# Patient Record
Sex: Male | Born: 1990 | Race: White | Hispanic: No | Marital: Single | Smoking: Never smoker
Health system: Southern US, Community
[De-identification: ages and names within clinical notes are randomized; demographics above are authoritative.]

## PROBLEM LIST (undated history)

## (undated) DIAGNOSIS — K219 Gastro-esophageal reflux disease without esophagitis: Secondary | ICD-10-CM

## (undated) DIAGNOSIS — J8281 Chronic eosinophilic pneumonia: Secondary | ICD-10-CM

## (undated) DIAGNOSIS — F410 Panic disorder [episodic paroxysmal anxiety] without agoraphobia: Secondary | ICD-10-CM

## (undated) DIAGNOSIS — F419 Anxiety disorder, unspecified: Secondary | ICD-10-CM

## (undated) DIAGNOSIS — G7281 Critical illness myopathy: Secondary | ICD-10-CM

## (undated) DIAGNOSIS — J984 Other disorders of lung: Secondary | ICD-10-CM

## (undated) DIAGNOSIS — J82 Pulmonary eosinophilia, not elsewhere classified: Secondary | ICD-10-CM

## (undated) HISTORY — DX: Critical illness myopathy: G72.81

## (undated) HISTORY — DX: Anxiety disorder, unspecified: F41.9

## (undated) HISTORY — DX: Gastro-esophageal reflux disease without esophagitis: K21.9

## (undated) HISTORY — DX: Chronic eosinophilic pneumonia: J82.81

## (undated) HISTORY — DX: Pulmonary eosinophilia, not elsewhere classified: J82

## (undated) HISTORY — DX: Panic disorder (episodic paroxysmal anxiety): F41.0

## (undated) HISTORY — DX: Other disorders of lung: J98.4

## (undated) HISTORY — PX: LUNG BIOPSY: SHX232

---
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
## 2011-02-06 LAB — CBC
HCT: 30.6 — ABNORMAL LOW
HCT: 30.8 — ABNORMAL LOW
HCT: 35.8
HCT: 41.6
Hemoglobin: 10.8 — ABNORMAL LOW
Hemoglobin: 12.4
Hemoglobin: 14.8 — ABNORMAL HIGH
MCHC: 34.6 — ABNORMAL HIGH
MCHC: 34.7 — ABNORMAL HIGH
MCHC: 34.9 — ABNORMAL HIGH
MCV: 87
MCV: 87.1
MCV: 87.2
Platelets: 383
RBC: 3.52 — ABNORMAL LOW
RBC: 3.54 — ABNORMAL LOW
RBC: 4.11
RBC: 4.87
RDW: 12.4
WBC: 5.6
WBC: 6.5
WBC: 8
WBC: 6.7

## 2011-02-06 LAB — POCT I-STAT 7, (LYTES, BLD GAS, ICA,H+H)
Acid-Base Excess: 1
Acid-Base Excess: 2
Acid-Base Excess: 3 — ABNORMAL HIGH
Acid-Base Excess: 3 — ABNORMAL HIGH
Acid-Base Excess: 7 — ABNORMAL HIGH
Bicarbonate: 24.9 — ABNORMAL HIGH
Bicarbonate: 25.9 — ABNORMAL HIGH
Bicarbonate: 26 — ABNORMAL HIGH
Bicarbonate: 26.5 — ABNORMAL HIGH
Bicarbonate: 26.6 — ABNORMAL HIGH
Bicarbonate: 27.5 — ABNORMAL HIGH
Bicarbonate: 28 — ABNORMAL HIGH
Bicarbonate: 29.7 — ABNORMAL HIGH
Bicarbonate: 31.9 — ABNORMAL HIGH
Calcium, Ion: 1.15
Calcium, Ion: 1.19
Calcium, Ion: 1.21
Calcium, Ion: 1.21
Calcium, Ion: 1.24
Calcium, Ion: 1.24
HCT: 28 — ABNORMAL LOW
HCT: 31 — ABNORMAL LOW
HCT: 32 — ABNORMAL LOW
HCT: 33
HCT: 33
Hemoglobin: 10.5 — ABNORMAL LOW
Hemoglobin: 10.9 — ABNORMAL LOW
Hemoglobin: 11.2
Hemoglobin: 11.2
Hemoglobin: 11.9
Hemoglobin: 11.9
Hemoglobin: 9.5 — ABNORMAL LOW
O2 Saturation: 90
O2 Saturation: 90
O2 Saturation: 93
O2 Saturation: 93
O2 Saturation: 95
O2 Saturation: 95
O2 Saturation: 96
O2 Saturation: 97
O2 Saturation: 99
Operator id: 190281
Operator id: 23604
Operator id: 236041
Operator id: 257081
Operator id: 270211
Operator id: 270221
Operator id: 277341
Patient temperature: 36.4
Patient temperature: 36.7
Patient temperature: 37.1
Patient temperature: 37.3
Patient temperature: 38
Patient temperature: 39.2
Patient temperature: 97.8
Patient temperature: 97.8
Patient temperature: 97.9
Potassium: 3.1 — ABNORMAL LOW
Potassium: 3.4 — ABNORMAL LOW
Potassium: 3.6
Potassium: 3.7
Potassium: 3.9
Potassium: 3.9
Potassium: 4.1
Sodium: 134 — ABNORMAL LOW
Sodium: 135
Sodium: 135
Sodium: 136
Sodium: 138
TCO2: 26
TCO2: 27
TCO2: 27
TCO2: 28
TCO2: 28
TCO2: 31
TCO2: 33
pCO2 arterial: 41.4
pCO2 arterial: 42.8
pCO2 arterial: 43.6
pCO2 arterial: 43.7
pCO2 arterial: 43.8
pCO2 arterial: 44.2
pCO2 arterial: 45.7 — ABNORMAL HIGH
pCO2 arterial: 47 — ABNORMAL HIGH
pCO2 arterial: 49.1 — ABNORMAL HIGH
pH, Arterial: 7.336 — ABNORMAL LOW
pH, Arterial: 7.389
pH, Arterial: 7.41
pH, Arterial: 7.417
pH, Arterial: 7.417
pH, Arterial: 7.421
pO2, Arterial: 57 — ABNORMAL LOW
pO2, Arterial: 57 — ABNORMAL LOW
pO2, Arterial: 60 — ABNORMAL LOW
pO2, Arterial: 62 — ABNORMAL LOW
pO2, Arterial: 68 — ABNORMAL LOW
pO2, Arterial: 70 — ABNORMAL LOW
pO2, Arterial: 72 — ABNORMAL LOW
pO2, Arterial: 74 — ABNORMAL LOW
pO2, Arterial: 95

## 2011-02-06 LAB — POCT I-STAT 3, ART BLOOD GAS (G3+)
Acid-Base Excess: 4 — ABNORMAL HIGH
Acid-Base Excess: 5 — ABNORMAL HIGH
Acid-Base Excess: 6 — ABNORMAL HIGH
Acid-Base Excess: 7 — ABNORMAL HIGH
Acid-Base Excess: 7 — ABNORMAL HIGH
Acid-base deficit: 2
Bicarbonate: 24.4 — ABNORMAL HIGH
Bicarbonate: 29.2 — ABNORMAL HIGH
Bicarbonate: 29.5 — ABNORMAL HIGH
Bicarbonate: 31.6 — ABNORMAL HIGH
Bicarbonate: 33.3 — ABNORMAL HIGH
O2 Saturation: 89
O2 Saturation: 91
O2 Saturation: 92
O2 Saturation: 96
O2 Saturation: 97
Operator id: 234041
Operator id: 245011
Operator id: 245011
Operator id: 297571
Operator id: 297571
Operator id: 297571
Patient temperature: 36.3
Patient temperature: 36.8
Patient temperature: 37
Patient temperature: 37
Patient temperature: 37
TCO2: 23
TCO2: 30
TCO2: 31
TCO2: 33
TCO2: 35
pCO2 arterial: 39.3
pCO2 arterial: 47.2 — ABNORMAL HIGH
pH, Arterial: 7.333 — ABNORMAL LOW
pH, Arterial: 7.376
pH, Arterial: 7.414
pH, Arterial: 7.434
pH, Arterial: 7.477 — ABNORMAL HIGH
pH, Arterial: 7.51 — ABNORMAL HIGH
pO2, Arterial: 113 — ABNORMAL HIGH
pO2, Arterial: 52 — ABNORMAL LOW
pO2, Arterial: 63 — ABNORMAL LOW
pO2, Arterial: 82
pO2, Arterial: 88

## 2011-02-06 LAB — LEGIONELLA PROFILE(CULTURE+DFA/SMEAR): Legionella Antigen (DFA): NEGATIVE

## 2011-02-06 LAB — AFB CULTURE WITH SMEAR (NOT AT ARMC)

## 2011-02-06 LAB — DIFFERENTIAL
Basophils Absolute: 0
Basophils Absolute: 0
Basophils Relative: 0
Basophils Relative: 0
Basophils Relative: 0
Eosinophils Absolute: 0.6
Eosinophils Relative: 0
Eosinophils Relative: 8 — ABNORMAL HIGH
Lymphocytes Relative: 10 — ABNORMAL LOW
Lymphocytes Relative: 7 — ABNORMAL LOW
Lymphs Abs: 0.4 — ABNORMAL LOW
Lymphs Abs: 0.7 — ABNORMAL LOW
Monocytes Absolute: 0.3
Monocytes Absolute: 0.7
Monocytes Relative: 8
Neutro Abs: 10.4 — ABNORMAL HIGH
Neutro Abs: 4.6
Neutrophils Relative %: 75 — ABNORMAL HIGH
Neutrophils Relative %: 87 — ABNORMAL HIGH
Neutrophils Relative %: 69 — ABNORMAL HIGH

## 2011-02-06 LAB — TRIGLYCERIDES: Triglycerides: 77

## 2011-02-06 LAB — VIRUS CULTURE
Preliminary Culture: NEGATIVE
Preliminary Culture: NEGATIVE

## 2011-02-06 LAB — ANA: Anti Nuclear Antibody(ANA): NEGATIVE

## 2011-02-06 LAB — COMPREHENSIVE METABOLIC PANEL
ALT: 113 — ABNORMAL HIGH
AST: 23
AST: 25
AST: 28
AST: 46 — ABNORMAL HIGH
Albumin: 2.7 — ABNORMAL LOW
Albumin: 4.2
Alkaline Phosphatase: 140
Alkaline Phosphatase: 95
BUN: 10
BUN: 12
CO2: 28
CO2: 28
CO2: 30
CO2: 35 — ABNORMAL HIGH
Calcium: 8.6
Calcium: 8.7
Calcium: 8.8
Calcium: 8.8
Chloride: 101
Chloride: 103
Creatinine, Ser: 0.55
Creatinine, Ser: 0.55
Creatinine, Ser: 0.62
Glucose, Bld: 128 — ABNORMAL HIGH
Glucose, Bld: 200 — ABNORMAL HIGH
Glucose, Bld: 95
Potassium: 4.3
Potassium: 4.3
Sodium: 134 — ABNORMAL LOW
Sodium: 140
Total Bilirubin: 0.6
Total Bilirubin: 1.2
Total Protein: 5.4 — ABNORMAL LOW
Total Protein: 6.1
Total Protein: 6.3

## 2011-02-06 LAB — SEDIMENTATION RATE: Sed Rate: 52 — ABNORMAL HIGH

## 2011-02-06 LAB — TISSUE CULTURE

## 2011-02-06 LAB — MISCELLANEOUS TEST
Miscellaneous Test Results: <1000
Miscellaneous Test Results: NOT DETECTED

## 2011-02-06 LAB — BASIC METABOLIC PANEL
Calcium: 9.4
Glucose, Bld: 159 — ABNORMAL HIGH
Sodium: 136

## 2011-02-06 LAB — FUNGUS CULTURE W SMEAR
Fungal Smear: NONE SEEN
Fungal Smear: NONE SEEN
Fungal Smear: NONE SEEN
Fungal Smear: NONE SEEN

## 2011-02-06 LAB — I-STAT EC8
Acid-Base Excess: 1
Bicarbonate: 25.3 — ABNORMAL HIGH
Chloride: 101
HCT: 37
Operator id: 257081
pCO2 arterial: 39.5
pH, Arterial: 7.414

## 2011-02-06 LAB — URINALYSIS, ROUTINE W REFLEX MICROSCOPIC
Bilirubin Urine: NEGATIVE
Glucose, UA: NEGATIVE
Hgb urine dipstick: NEGATIVE
Hgb urine dipstick: NEGATIVE
Ketones, ur: 15 — AB
Protein, ur: 100 — AB
Specific Gravity, Urine: 1.024
pH: 5.5
pH: 5.5

## 2011-02-06 LAB — HISTOPLASMA ANTIGEN, URINE
Histoplasma Antigen Urine: NEGATIVE
Histoplasma Antigen, urine: <2 {EIA'U}

## 2011-02-06 LAB — URINE MICROSCOPIC-ADD ON

## 2011-02-06 LAB — CARBOXYHEMOGLOBIN
Carboxyhemoglobin: 1.3
Carboxyhemoglobin: 1.3
O2 Saturation: 91.1
O2 Saturation: 92.9
Total hemoglobin: 11.3 — ABNORMAL LOW

## 2011-02-06 LAB — BODY FLUID CULTURE
Culture: NO GROWTH
Culture: NO GROWTH
Gram Stain: NONE SEEN

## 2011-02-06 LAB — C4 COMPLEMENT: Complement C4, Body Fluid: 16

## 2011-02-06 LAB — APTT: aPTT: 22 — ABNORMAL LOW

## 2011-02-06 LAB — CULTURE, BAL-QUANTITATIVE W GRAM STAIN
Colony Count: NO GROWTH
Culture: NO GROWTH
Gram Stain: NONE SEEN

## 2011-02-06 LAB — PROTIME-INR: INR: 1.2

## 2011-02-06 LAB — IGE: IgE (Immunoglobulin E), Serum: 426.1 — ABNORMAL HIGH

## 2011-02-06 LAB — C-REACTIVE PROTEIN
CRP: 1.5 — ABNORMAL HIGH (ref <0.6)
CRP: 14.4 — ABNORMAL HIGH (ref <0.6)

## 2011-02-06 LAB — P CARINII SMEAR DFA: Pneumocystis carinii DFA: NEGATIVE

## 2011-02-06 LAB — RHEUMATOID FACTOR: Rhuematoid fact SerPl-aCnc: <20

## 2011-02-06 LAB — EXPECTORATED SPUTUM ASSESSMENT W GRAM STAIN, RFLX TO RESP C

## 2011-02-06 LAB — CULTURE, RESPIRATORY W GRAM STAIN

## 2011-02-06 LAB — HIV ANTIBODY (ROUTINE TESTING W REFLEX): HIV: NONREACTIVE

## 2011-02-06 LAB — LEGIONELLA ANTIGEN, URINE: Legionella Antigen, Urine: NEGATIVE

## 2011-02-06 LAB — CULTURE, BLOOD (ROUTINE X 2): Culture: NO GROWTH

## 2011-12-20 ENCOUNTER — Encounter: Payer: Self-pay | Admitting: Critical Care Medicine

## 2011-12-21 ENCOUNTER — Ambulatory Visit: Payer: Self-pay | Admitting: Critical Care Medicine

## 2012-01-11 ENCOUNTER — Ambulatory Visit: Payer: Self-pay | Admitting: Critical Care Medicine

## 2012-02-05 ENCOUNTER — Encounter: Payer: Self-pay | Admitting: Critical Care Medicine

## 2012-02-05 ENCOUNTER — Ambulatory Visit (INDEPENDENT_AMBULATORY_CARE_PROVIDER_SITE_OTHER): Payer: BC Managed Care – PPO | Admitting: Critical Care Medicine

## 2012-02-05 VITALS — BP 140/98 | HR 110 | Temp 98.3°F | Ht 72.0 in | Wt 186.0 lb

## 2012-02-05 DIAGNOSIS — J984 Other disorders of lung: Secondary | ICD-10-CM

## 2012-02-05 DIAGNOSIS — J8289 Other pulmonary eosinophilia, not elsewhere classified: Secondary | ICD-10-CM

## 2012-02-05 MED ORDER — MOMETASONE FUROATE 50 MCG/ACT NA SUSP: 2.0000 | Freq: Every day | NASAL | Status: DC

## 2012-02-05 MED ORDER — BUDESONIDE 180 MCG/ACT IN AEPB: INHALATION_SPRAY | RESPIRATORY_TRACT | Status: DC

## 2012-02-05 NOTE — Patient Instructions (Signed)
Start Pulmicort two puff twice daily for two weeks then once daily Start Nasonex two puff daily each nostril No other medication changes Return 6 months

## 2012-02-05 NOTE — Progress Notes (Signed)
Subjective:    Patient ID: Matthew Valdez, male    DOB: October 02, 1990, 21 y.o.   MRN: [id number]  HPI 21 y.o. 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 Also hx of farming.  Pt in hosp for 6weeks . Pt @ [facility] one week, on vent, in icu. needed oscillator. Pt then tfr to [facility] through October. Then rehab two weeks.  Out pat rehab three weeks Until 12/08  Pt followed by [facility] peds pulmonary and now tfr to this office for ongoing care.  Pt is slowly better. Played high school soccer and baseball. Pt denies any cough. Notes chest pain with strenous activity. No real sinus pressure. No real wheeze. ? if proair helps prior to exercise  PFTs show restriction only.  Pt on on celexa. Pt has friends notice mood changes. . Pt gets more anxious and mood changes  Pt gets angry at times  No real flashbacks with illness   02/05/2012 Pt still at school at [employer].  Off celexa 7months. Pt with anxiety issues.  Pt stays nervous a lot. Pt notes more cough past two weeks.  More allergy issues.  No real chest pain, just more cough. More brown mucus. occ clear.  No real chest pain. Able to run a mile three times per week.  Some chest pain on right side.  Notes some pndrip.   Past Medical History  Diagnosis Date  . Panic disorder   . Anxiety disorder   . Critical illness myopathy   . GERD (gastroesophageal reflux disease)   . Eosinophilic pneumonia   . Restrictive lung disease      Family History  Problem Relation Age of Onset  . Pancreatic cancer Paternal Grandfather      History   Social History  . Marital Status: Single    Spouse Name: N/A    Number of Children: N/A  . Years of Education: N/A   Occupational History  . Not on file.   Social History Main Topics  . Smoking status: Never Smoker   . Smokeless tobacco: Current User  . Alcohol Use: Yes     socially  . Drug Use: Not on file  .  Sexually Active: Not on file   Other Topics Concern  . Not on file   Social History Narrative  . No narrative on file     Allergies  Allergen Reactions  . Sulfonamide Derivatives      Outpatient Prescriptions Prior to Visit  Medication Sig Dispense Refill  . citalopram (CELEXA) 10 MG tablet Take 10 mg by mouth daily.      Marland Kitchen albuterol (PROVENTIL HFA;VENTOLIN HFA) 108 (90 BASE) MCG/ACT inhaler Inhale 1-2 puffs into the lungs every 4 (four) hours as needed.          Review of Systems Constitutional:   No  weight loss, night sweats,  Fevers, chills, fatigue, lassitude. HEENT:   No headaches,  Difficulty swallowing,  Tooth/dental problems,  Sore throat,                No sneezing, itching, ear ache, nasal congestion, ++++post nasal drip,   CV:  No chest pain,  Orthopnea, PND, swelling in lower extremities, anasarca, dizziness, palpitations  GI  No heartburn, indigestion, abdominal pain, nausea, vomiting, diarrhea, change in bowel habits, loss of appetite  Resp: Notes shortness of breath with exertion not at rest.  No  excess mucus, notes  productive cough,  No non-productive cough,  No coughing up of blood.  Notes  change in color of mucus.  No wheezing.  No chest wall deformity  Skin: no rash or lesions.  GU: no dysuria, change in color of urine, no urgency or frequency.  No flank pain.  MS:  No joint pain or swelling.  No decreased range of motion.  No back pain.  Psych:  No change in mood or affect. No depression or anxiety.  No memory loss.     Objective:   Physical Exam Filed Vitals:   02/05/12 1110  BP: 140/98  Pulse: 110  Temp: 98.3 F (36.8 C)  TempSrc: Oral  Height: 6' (1.829 m)  Weight: 186 lb (84.369 kg)  SpO2: 94%    Gen: Pleasant, well-nourished, in no distress,  normal affect  ENT: No lesions,  mouth clear,  oropharynx clear, no postnasal drip  Neck: No JVD, no TMG, no carotid bruits  Lungs: No use of accessory muscles, no dullness to percussion,  clear without rales or rhonchi  Cardiovascular: RRR, heart sounds normal, no murmur or gallops, no peripheral edema  Abdomen: soft and NT, no HSM,  BS normal  Musculoskeletal: No deformities, no cyanosis or clubbing  Neuro: alert, non focal  Skin: Warm, no lesions or rashes  Cleda Daub 02/05/2012  Mild restriction and obstruction        Assessment & Plan:   Moderate persistent asthma History of eosinophilic pneumonia now with no evidence of recurrence but ongoing moderate persistent asthma with external atopic features Plan Begin Pulmicort 2 puff twice daily for 2 weeks then reduce to 2 puffs daily thereafter Begin Nasonex 2 puffs each nostril daily Patient was instructed as to proper use of the inhalers Return 6 months   Updated Medication List Outpatient Encounter Prescriptions as of 02/05/2012  Medication Sig Dispense Refill  . citalopram (CELEXA) 10 MG tablet Take 10 mg by mouth daily.      Marland Kitchen albuterol (PROVENTIL HFA;VENTOLIN HFA) 108 (90 BASE) MCG/ACT inhaler Inhale 1-2 puffs into the lungs every 4 (four) hours as needed.      . budesonide (PULMICORT FLEXHALER) 180 MCG/ACT inhaler Two puff twice daily for 2 weeks then daily  1 each  5  . mometasone (NASONEX) 50 MCG/ACT nasal spray Place 2 sprays into the nose daily.  17 g  1

## 2012-02-05 NOTE — Assessment & Plan Note (Signed)
History of eosinophilic pneumonia now with no evidence of recurrence but ongoing moderate persistent asthma with external atopic features Plan Begin Pulmicort 2 puff twice daily for 2 weeks then reduce to 2 puffs daily thereafter Begin Nasonex 2 puffs each nostril daily Patient was instructed as to proper use of the inhalers Return 6 months

## 2012-07-31 ENCOUNTER — Other Ambulatory Visit: Payer: Self-pay | Admitting: Family Medicine

## 2012-08-22 ENCOUNTER — Encounter: Payer: Self-pay | Admitting: Critical Care Medicine

## 2012-08-22 ENCOUNTER — Ambulatory Visit (INDEPENDENT_AMBULATORY_CARE_PROVIDER_SITE_OTHER): Payer: BC Managed Care – PPO | Admitting: Critical Care Medicine

## 2012-08-22 VITALS — BP 120/78 | HR 74 | Temp 97.8°F | Ht 72.0 in | Wt 192.0 lb

## 2012-08-22 DIAGNOSIS — J45909 Unspecified asthma, uncomplicated: Secondary | ICD-10-CM

## 2012-08-22 DIAGNOSIS — J984 Other disorders of lung: Secondary | ICD-10-CM

## 2012-08-22 DIAGNOSIS — Z8709 Personal history of other diseases of the respiratory system: Secondary | ICD-10-CM | POA: Insufficient documentation

## 2012-08-22 NOTE — Progress Notes (Signed)
Subjective:    Patient ID: Matthew Valdez, male    DOB: [birth year], 22 y.o.   MRN: [id number]  HPI  22 y.o. [facility]   08/22/2012 No real wheeze.  Notes some cough.  Notes some nasal drainage.  No real dyspnea. Anxiety is better.  4 more classes. PT assistant program.   Past Medical History  Diagnosis Date  . Panic disorder   . Anxiety disorder   . Critical illness myopathy   . GERD (gastroesophageal reflux disease)   . Eosinophilic pneumonia   . Restrictive lung disease      Family History  Problem Relation Age of Onset  . Pancreatic cancer Paternal Grandfather      History   Social History  . Marital Status: Single    Spouse Name: N/A    Number of Children: N/A  . Years of Education: N/A   Occupational History  . Not on file.   Social History Main Topics  . Smoking status: Never Smoker   . Smokeless tobacco: Former Neurosurgeon  . Alcohol Use: Yes     Comment: socially  . Drug Use: Not on file  . Sexually Active: Not on file   Other Topics Concern  . Not on file   Social History Narrative  . No narrative on file     Allergies  Allergen Reactions  . Sulfonamide Derivatives      Outpatient Prescriptions Prior to Visit  Medication Sig Dispense Refill  . albuterol (PROVENTIL HFA;VENTOLIN HFA) 108 (90 BASE) MCG/ACT inhaler Inhale 1-2 puffs into the lungs every 4 (four) hours as needed.      . budesonide (PULMICORT FLEXHALER) 180 MCG/ACT inhaler Two puff twice daily for 2 weeks then daily  1 each  5  . citalopram (CELEXA) 10 MG tablet Take 10 mg by mouth daily.      Marland Kitchen escitalopram (LEXAPRO) 20 MG tablet 1 po qd  30 tablet  0  . mometasone (NASONEX) 50 MCG/ACT nasal spray Place 2 sprays into the nose daily.  17 g  1   No facility-administered medications prior to visit.   Review of Systems  Constitutional:   No  weight loss, night sweats,  Fevers, chills, fatigue, lassitude. HEENT:   No headaches,  Difficulty swallowing,  Tooth/dental problems,  Sore throat,                 No sneezing, itching, ear ache, nasal congestion, ++post nasal drip,   CV:  No chest pain,  Orthopnea, PND, swelling in lower extremities, anasarca, dizziness, palpitations  GI  No heartburn, indigestion, abdominal pain, nausea, vomiting, diarrhea, change in bowel habits, loss of appetite  Resp: No shortness of breath with exertion not at rest.  No excess mucus, notes  productive cough,  No non-productive cough,  No coughing up of blood.  Notes  change in color of mucus.  No wheezing.  No chest wall deformity  Skin: no rash or lesions.  GU: no dysuria, change in color of urine, no urgency or frequency.  No flank pain.  MS:  No joint pain or swelling.  No decreased range of motion.  No back pain.  Psych:  No change in mood or affect. No depression or anxiety.  No memory loss.     Objective:   Physical Exam  Filed Vitals:   08/22/12 1043  BP: 120/78  Pulse: 74  Temp: 97.8 F (36.6 C)  TempSrc: Oral  Height: 6' (1.829 m)  Weight: 192 lb (87.091 kg)  SpO2: 96%    Gen: Pleasant, well-nourished, in no distress,  normal affect  ENT: No lesions,  mouth clear,  oropharynx clear, no postnasal drip  Neck: No JVD, no TMG, no carotid bruits  Lungs: No use of accessory muscles, no dullness to percussion, clear without rales or rhonchi  Cardiovascular: RRR, heart sounds normal, no murmur or gallops, no peripheral edema  Abdomen: soft and NT, no HSM,  BS normal  Musculoskeletal: No deformities, no cyanosis or clubbing  Neuro: alert, non focal  Skin: Warm, no lesions or rashes  Cleda Daub 08/22/2012  Mild restriction        Assessment & Plan:   Moderate persistent asthma Moderate persistent asthma stable at this time off inhaled medications Plan No additional inhaled steroids indicated  RESTRICTIVE LUNG DISEASE Restrictive lung disease on the basis of ARDS in 2008 now improved Note pulmonary functions stable compared to 2013 Plan No additional pulmonary function  testing needed Patient is to monitor weight gain and dietary habits Return as needed     Updated Medication List Outpatient Encounter Prescriptions as of 08/22/2012  Medication Sig Dispense Refill  . Vitamin D, Ergocalciferol, (DRISDOL) 50000 UNITS CAPS Take 50,000 Units by mouth every 7 (seven) days.      . [DISCONTINUED] Vitamin D, Ergocalciferol, (DRISDOL) 50000 UNITS CAPS Take 50,000 Units by mouth every 7 (seven) days.      . [DISCONTINUED] albuterol (PROVENTIL HFA;VENTOLIN HFA) 108 (90 BASE) MCG/ACT inhaler Inhale 1-2 puffs into the lungs every 4 (four) hours as needed.      . [DISCONTINUED] budesonide (PULMICORT FLEXHALER) 180 MCG/ACT inhaler Two puff twice daily for 2 weeks then daily  1 each  5  . [DISCONTINUED] citalopram (CELEXA) 10 MG tablet Take 10 mg by mouth daily.      . [DISCONTINUED] escitalopram (LEXAPRO) 20 MG tablet 1 po qd  30 tablet  0  . [DISCONTINUED] mometasone (NASONEX) 50 MCG/ACT nasal spray Place 2 sprays into the nose daily.  17 g  1   No facility-administered encounter medications on file as of 08/22/2012.

## 2012-08-22 NOTE — Assessment & Plan Note (Signed)
Moderate persistent asthma stable at this time off inhaled medications Plan No additional inhaled steroids indicated

## 2012-08-22 NOTE — Assessment & Plan Note (Signed)
Restrictive lung disease on the basis of ARDS in 2008 now improved Note pulmonary functions stable compared to 2013 Plan No additional pulmonary function testing needed Patient is to monitor weight gain and dietary habits Return as needed

## 2012-08-22 NOTE — Patient Instructions (Addendum)
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  [phone]    [phone]

## 2012-08-31 ENCOUNTER — Other Ambulatory Visit: Payer: Self-pay | Admitting: Family Medicine

## 2012-09-02 ENCOUNTER — Other Ambulatory Visit: Payer: Self-pay | Admitting: Family Medicine

## 2012-10-03 ENCOUNTER — Ambulatory Visit: Payer: Self-pay | Admitting: Family Medicine

## 2012-10-06 ENCOUNTER — Other Ambulatory Visit: Payer: Self-pay | Admitting: Family Medicine

## 2012-10-07 ENCOUNTER — Telehealth: Payer: Self-pay | Admitting: Family Medicine

## 2012-10-07 ENCOUNTER — Ambulatory Visit (INDEPENDENT_AMBULATORY_CARE_PROVIDER_SITE_OTHER): Payer: BC Managed Care – PPO | Admitting: General Practice

## 2012-10-07 ENCOUNTER — Encounter: Payer: Self-pay | Admitting: General Practice

## 2012-10-07 VITALS — BP 125/77 | HR 74 | Temp 97.2°F | Ht 72.0 in | Wt 186.0 lb

## 2012-10-07 DIAGNOSIS — J060 Acute laryngopharyngitis: Secondary | ICD-10-CM

## 2012-10-07 LAB — POCT RAPID STREP A (OFFICE): Rapid Strep A Screen: NEGATIVE

## 2012-10-07 MED ORDER — AMOXICILLIN 500 MG PO CAPS: 500.0000 mg | ORAL_CAPSULE | Freq: Two times a day (BID) | ORAL | Status: DC

## 2012-10-07 NOTE — Patient Instructions (Addendum)
Laryngitis At the top of your windpipe is your voice box. It is the source of your voice. Inside your voice box are 2 bands of muscles called vocal cords. When you breathe, your vocal cords are relaxed and open so that air can get into the lungs. When you decide to say something, these cords come together and vibrate. The sound from these vibrations goes into your throat and comes out through your mouth as sound. Laryngitis is an inflammation of the vocal cords that causes hoarseness, cough, loss of voice, sore throat, and dry throat. Laryngitis can be temporary (acute) or long-term (chronic). Most cases of acute laryngitis improve with time.Chronic laryngitis lasts for more than 3 weeks. CAUSES Laryngitis can often be related to excessive smoking, talking, or yelling, as well as inhalation of toxic fumes and allergies. Acute laryngitis is usually caused by a viral infection, vocal strain, measles or mumps, or bacterial infections. Chronic laryngitis is usually caused by vocal cord strain, vocal cord injury, postnasal drip, growths on the vocal cords, or acid reflux. SYMPTOMS   Cough.  Sore throat.  Dry throat. RISK FACTORS  Respiratory infections.  Exposure to irritating substances, such as cigarette smoke, excessive amounts of alcohol, stomach acids, and workplace chemicals.  Voice trauma, such as vocal cord injury from shouting or speaking too loud. DIAGNOSIS  Your cargiver will perform a physical exam. During the physical exam, your caregiver will examine your throat. The most common sign of laryngitis is hoarseness. Laryngoscopy may be necessary to confirm the diagnosis of this condition. This procedure allows your caregiver to look into the larynx. HOME CARE INSTRUCTIONS  Drink enough fluids to keep your urine clear or pale yellow.  Rest until you no longer have symptoms or as directed by your caregiver.  Breathe in moist air.  Take all medicine as directed by your  caregiver.  Do not smoke.  Talk as little as possible (this includes whispering).  Write on paper instead of talking until your voice is back to normal.  Follow up with your caregiver if your condition has not improved after 10 days. SEEK MEDICAL CARE IF:   You have trouble breathing.  You cough up blood.  You have persistent fever.  You have increasing pain.  You have difficulty swallowing. MAKE SURE YOU:  Understand these instructions.  Will watch your condition.  Will get help right away if you are not doing well or get worse. Document Released: 04/10/2005 Document Revised: 07/03/2011 Document Reviewed: 06/16/2010 Exit[employer] Patient Information 2014 [employer].  

## 2012-10-07 NOTE — Progress Notes (Signed)
  Subjective:    Patient ID: Matthew Valdez, male    DOB: 08-21-1990, 22 y.o.   MRN: [id number]  HPI Presents today with complaints of sore throat, change in voice (hoarse), and coughing. Reports onset was one day ago. Reports a productive cough, clear. Also drainage noted from left eye. Reports having seasonal allergies. Denies taking OTC medications.     Review of Systems  Constitutional: Negative for fever and chills.  HENT: Positive for congestion and sneezing. Negative for postnasal drip and sinus pressure.   Respiratory: Positive for cough. Negative for chest tightness, shortness of breath and wheezing.   Cardiovascular: Negative for chest pain and palpitations.  Gastrointestinal: Negative for abdominal pain and blood in stool.  Genitourinary: Negative for dysuria, flank pain and difficulty urinating.  Neurological: Positive for dizziness. Negative for headaches.       Objective:   Physical Exam  Constitutional: He is oriented to person, place, and time. He appears well-developed and well-nourished.  HENT:  Head: Normocephalic and atraumatic.  Right Ear: External ear normal.  Left Ear: External ear normal.  Mouth/Throat: Posterior oropharyngeal erythema present.  Cardiovascular: Normal rate, regular rhythm and normal heart sounds.   Pulmonary/Chest: Effort normal and breath sounds normal. No respiratory distress. He exhibits no tenderness.  Neurological: He is alert and oriented to person, place, and time.  Skin: Skin is warm and dry.  Psychiatric: He has a normal mood and affect.   Results for orders placed in visit on 10/07/12  POCT RAPID STREP A (OFFICE)      Result Value Range   Rapid Strep A Screen Negative  Negative          Assessment & Plan:  1. Sore throat and laryngitis - POCT rapid strep A - amoxicillin (AMOXIL) 500 MG capsule; Take 1 capsule (500 mg total) by mouth 2 (two) times daily.  Dispense: 20 capsule; Refill: 0 -increase fluid intake -Rest  body and voice -Refrain from talking loud, yelling, or straining voice -RTO in one week for follow up and sooner if possible -Patient verbalized understanding -Coralie Keens, FNP-C

## 2012-10-07 NOTE — Telephone Encounter (Signed)
appt made

## 2012-10-14 ENCOUNTER — Encounter: Payer: Self-pay | Admitting: General Practice

## 2012-10-14 ENCOUNTER — Ambulatory Visit (INDEPENDENT_AMBULATORY_CARE_PROVIDER_SITE_OTHER): Payer: BC Managed Care – PPO | Admitting: General Practice

## 2012-10-14 VITALS — BP 120/69 | HR 74 | Temp 97.1°F | Ht 72.0 in | Wt 192.0 lb

## 2012-10-14 DIAGNOSIS — J309 Allergic rhinitis, unspecified: Secondary | ICD-10-CM

## 2012-10-14 DIAGNOSIS — H0259 Other disorders affecting eyelid function: Secondary | ICD-10-CM

## 2012-10-14 DIAGNOSIS — J302 Other seasonal allergic rhinitis: Secondary | ICD-10-CM

## 2012-10-14 DIAGNOSIS — H0289 Other specified disorders of eyelid: Secondary | ICD-10-CM

## 2012-10-14 MED ORDER — METHYLPREDNISOLONE ACETATE 80 MG/ML IJ SUSP
80.0000 mg | Freq: Once | INTRAMUSCULAR | Status: AC
  Administered 2012-10-14: 80 mg via INTRAMUSCULAR

## 2012-10-14 NOTE — Progress Notes (Signed)
  Subjective:    Patient ID: Matthew Valdez, male    DOB: Jan 30, 1991, 22 y.o.   MRN: [id number]  HPI Presents today for follow up of sore throat and laryngitis. He reports those issues have resolved. He reports having dried secretions around eye in am for past two days. Also reports some puffiness to lower eye lids. Reports headache and dizziness during the day yesterday.     Review of Systems  Constitutional: Negative for fever and chills.  HENT: Negative for congestion, sore throat, rhinorrhea and sinus pressure.   Eyes: Positive for discharge and itching. Negative for photophobia, pain, redness and visual disturbance.       Exudate every morning  Respiratory: Negative for cough, chest tightness and shortness of breath.   Cardiovascular: Negative for chest pain and palpitations.  Genitourinary: Negative for dysuria and difficulty urinating.  Neurological: Positive for dizziness. Negative for weakness and headaches.       Objective:   Physical Exam  Constitutional: He is oriented to person, place, and time. He appears well-developed and well-nourished.  HENT:  Head: Normocephalic and atraumatic.  Right Ear: External ear normal.  Left Ear: External ear normal.  Eyes: EOM are normal. Right eye exhibits no discharge. Left eye exhibits no discharge. No scleral icterus.  Cardiovascular: Normal rate, regular rhythm and normal heart sounds.   Pulmonary/Chest: Effort normal and breath sounds normal. No respiratory distress. He exhibits no tenderness.  Neurological: He is alert and oriented to person, place, and time.  Skin: Skin is warm and dry.  Psychiatric: He has a normal mood and affect.          Assessment & Plan:  1. Superficial swelling of eyelid - methylPREDNISolone acetate (DEPO-MEDROL) injection 80 mg; Inject 1 mL (80 mg total) into the muscle once.  2. Seasonal allergies -avoid allergens -may take OTC allerga or claritin to help manage allergies -proper  handwashing -RTO if symptoms worsen or seek emergent medical attention -Patient verbalized understanding -Coralie Keens, FNP-C

## 2012-11-26 ENCOUNTER — Ambulatory Visit (INDEPENDENT_AMBULATORY_CARE_PROVIDER_SITE_OTHER): Payer: BC Managed Care – PPO | Admitting: Family Medicine

## 2012-11-26 ENCOUNTER — Encounter: Payer: Self-pay | Admitting: Family Medicine

## 2012-11-26 VITALS — BP 136/82 | HR 75 | Temp 98.1°F | Ht 72.0 in | Wt 186.0 lb

## 2012-11-26 DIAGNOSIS — Z8709 Personal history of other diseases of the respiratory system: Secondary | ICD-10-CM

## 2012-11-26 DIAGNOSIS — F411 Generalized anxiety disorder: Secondary | ICD-10-CM

## 2012-11-26 DIAGNOSIS — F410 Panic disorder [episodic paroxysmal anxiety] without agoraphobia: Secondary | ICD-10-CM

## 2012-11-26 NOTE — Progress Notes (Signed)
  Subjective:    Patient ID: Matthew Valdez, male    DOB: 05-01-1990, 22 y.o.   MRN: [id number]  HPI Patient comes in today for followup of anxiety posttraumatic stress disorder and a history of respiratory distress syndrome in his high school years. He is currently not taking any medication regularly. He mentions that he saw the pulmonologist in February.   Review of Systems  Constitutional: Positive for fatigue (improving). Negative for activity change and appetite change.  HENT: Negative.  Negative for ear pain, congestion, sore throat, postnasal drip and tinnitus.   Eyes: Negative.   Respiratory: Negative.  Negative for cough, shortness of breath and wheezing.   Cardiovascular: Negative for chest pain, palpitations and leg swelling.  Gastrointestinal: Negative for nausea, vomiting, abdominal pain, diarrhea and constipation.  Endocrine: Negative.   Musculoskeletal: Negative.  Negative for myalgias, back pain, joint swelling and arthralgias.  Allergic/Immunologic: Negative.   Neurological: Negative for dizziness, tremors, weakness and headaches.  Psychiatric/Behavioral: Negative.  Negative for sleep disturbance. The patient is not nervous/anxious.        Objective:   Physical Exam  Vitals reviewed. Constitutional: He is oriented to person, place, and time. He appears well-developed and well-nourished. No distress.  HENT:  Head: Normocephalic and atraumatic.  Right Ear: External ear normal.  Left Ear: External ear normal.  Nose: Nose normal.  Mouth/Throat: Oropharynx is clear and moist. No oropharyngeal exudate.  Eyes: Conjunctivae are normal. Pupils are equal, round, and reactive to light. Right eye exhibits no discharge. Left eye exhibits no discharge. No scleral icterus.  Neck: Normal range of motion. Neck supple. No thyromegaly present.  Cardiovascular: Normal rate, regular rhythm and normal heart sounds.  Exam reveals no gallop and no friction rub.   No murmur  heard. Pulmonary/Chest: Effort normal and breath sounds normal. No respiratory distress. He has no wheezes. He has no rales.  Abdominal: Soft. He exhibits no distension. There is no tenderness. There is no guarding.  Musculoskeletal: Normal range of motion. He exhibits no edema.  Lymphadenopathy:    He has no cervical adenopathy.  Neurological: He is alert and oriented to person, place, and time. He has normal reflexes.  Skin: Skin is warm. Rash noted. He is diaphoretic. No erythema.  Mild contact dermatitis on abdomen  Psychiatric: He has a normal mood and affect. His behavior is normal. Judgment and thought content normal.          Assessment & Plan:   PANIC DISORDER  ANXIETY DISORDER  ARDS survivor    Patient Instructions  Continue healthy lifestyle Always be safe as far as respiratory environments are concerned Always drink plenty of fluids and stay well hydrated   Be sure and get flu shot by the end of September or early October  Nyra Capes MD

## 2012-11-26 NOTE — Patient Instructions (Signed)
Continue healthy lifestyle Always be safe as far as respiratory environments are concerned Always drink plenty of fluids and stay well hydrated

## 2012-12-04 ENCOUNTER — Telehealth: Payer: Self-pay | Admitting: Family Medicine

## 2012-12-05 NOTE — Telephone Encounter (Signed)
Invalid number Attempted to call home and cell numbers [facility]

## 2012-12-16 NOTE — Telephone Encounter (Signed)
Attempted to call mother NA on cell LMOM at home

## 2013-01-20 ENCOUNTER — Ambulatory Visit: Payer: BC Managed Care – PPO | Admitting: Pulmonary Disease

## 2013-01-20 ENCOUNTER — Telehealth: Payer: Self-pay | Admitting: Critical Care Medicine

## 2013-01-20 NOTE — Telephone Encounter (Signed)
I spoke with pt mother. We scheduled her appt to come in and see RA in [place] tomorrow as pt has class and this works with his schedule

## 2013-01-21 ENCOUNTER — Ambulatory Visit: Payer: BC Managed Care – PPO | Admitting: Pulmonary Disease

## 2013-02-03 ENCOUNTER — Ambulatory Visit (INDEPENDENT_AMBULATORY_CARE_PROVIDER_SITE_OTHER): Payer: BC Managed Care – PPO | Admitting: Critical Care Medicine

## 2013-02-03 ENCOUNTER — Encounter: Payer: Self-pay | Admitting: Critical Care Medicine

## 2013-02-03 VITALS — BP 114/80 | HR 72 | Ht 72.0 in | Wt 185.0 lb

## 2013-02-03 DIAGNOSIS — Z23 Encounter for immunization: Secondary | ICD-10-CM

## 2013-02-03 DIAGNOSIS — J4551 Severe persistent asthma with (acute) exacerbation: Secondary | ICD-10-CM

## 2013-02-03 DIAGNOSIS — J45901 Unspecified asthma with (acute) exacerbation: Secondary | ICD-10-CM

## 2013-02-03 DIAGNOSIS — J45902 Unspecified asthma with status asthmaticus: Secondary | ICD-10-CM

## 2013-02-03 MED ORDER — AZITHROMYCIN 250 MG PO TABS: 250.0000 mg | ORAL_TABLET | Freq: Every day | ORAL | Status: DC

## 2013-02-03 MED ORDER — PREDNISONE 10 MG PO TABS: ORAL_TABLET | ORAL | Status: DC

## 2013-02-03 MED ORDER — BECLOMETHASONE DIPROPIONATE 40 MCG/ACT IN AERS: 2.0000 | INHALATION_SPRAY | Freq: Two times a day (BID) | RESPIRATORY_TRACT | Status: DC

## 2013-02-03 NOTE — Progress Notes (Signed)
Subjective:    Patient ID: Matthew Valdez, male    DOB: 04-May-1990, 22 y.o.   MRN: [id number]  HPI  22 y.o. [name]   08/22/2012 No real wheeze.  Notes some cough.  Notes some nasal drainage.  No real dyspnea. Anxiety is better.  4 more classes. PT assistant program.   02/03/2013 Chief Complaint  Patient presents with  . Follow-up    Breathing a little worse since last OV.  Has good days and bad days.  Has SOB mostly with exertion, chest tightness at times and prod cough with brown mucus.   Pt notes more tightness in chest .  Notes more mucus. Yellow to brown. Not real qhs dyspnea.  1 Pm.  (11-3pm) No real pndrip.  Notes some blowing of nose  Past Medical History  Diagnosis Date  . Panic disorder   . Anxiety disorder   . Critical illness myopathy   . GERD (gastroesophageal reflux disease)   . Eosinophilic pneumonia   . Restrictive lung disease      Family History  Problem Relation Age of Onset  . Pancreatic cancer Paternal Grandfather   . Hyperlipidemia Father      History   Social History  . Marital Status: Single    Spouse Name: N/A    Number of Children: N/A  . Years of Education: N/A   Occupational History  . Not on file.   Social History Main Topics  . Smoking status: Never Smoker   . Smokeless tobacco: Former Neurosurgeon  . Alcohol Use: Yes     Comment: socially  . Drug Use: Not on file  . Sexual Activity: Not on file   Other Topics Concern  . Not on file   Social History Narrative  . No narrative on file     Allergies  Allergen Reactions  . Sulfonamide Derivatives      Outpatient Prescriptions Prior to Visit  Medication Sig Dispense Refill  . Vitamin D, Ergocalciferol, (DRISDOL) 50000 UNITS CAPS TAKE ONE CAPSULE BY MOUTH WEEKLY  12 capsule  0   No facility-administered medications prior to visit.   Review of Systems  Constitutional:   No  weight loss, night sweats,  Fevers, chills, fatigue, lassitude. HEENT:   No headaches,  Difficulty  swallowing,  Tooth/dental problems,  Sore throat,                No sneezing, itching, ear ache, nasal congestion, ++post nasal drip,   CV:  No chest pain,  Orthopnea, PND, swelling in lower extremities, anasarca, dizziness, palpitations  GI  No heartburn, indigestion, abdominal pain, nausea, vomiting, diarrhea, change in bowel habits, loss of appetite  Resp: No shortness of breath with exertion not at rest.  No excess mucus, notes  productive cough,  No non-productive cough,  No coughing up of blood.  Notes  change in color of mucus.  No wheezing.  No chest wall deformity  Skin: no rash or lesions.  GU: no dysuria, change in color of urine, no urgency or frequency.  No flank pain.  MS:  No joint pain or swelling.  No decreased range of motion.  No back pain.  Psych:  No change in mood or affect. No depression or anxiety.  No memory loss.     Objective:   Physical Exam  Filed Vitals:   02/03/13 1427  BP: 114/80  Pulse: 72  Height: 6' (1.829 m)  Weight: 185 lb (83.915 kg)  SpO2: 98%    Gen:  Pleasant, well-nourished, in no distress,  normal affect  ENT: No lesions,  mouth clear,  oropharynx clear, no postnasal drip  Neck: No JVD, no TMG, no carotid bruits  Lungs: No use of accessory muscles, no dullness to percussion,expir wheezes, poor airflow   Cardiovascular: RRR, heart sounds normal, no murmur or gallops, no peripheral edema  Abdomen: soft and NT, no HSM,  BS normal  Musculoskeletal: No deformities, no cyanosis or clubbing  Neuro: alert, non focal  Skin: Warm, no lesions or rashes  Cleda Daub 02/04/2013  Mild restriction        Assessment & Plan:   Severe persistent asthma with exacerbation Severe persistent asthma with ongoing airway inflammation and not on controller therapy Note IgE >300 with multiple pos positive allergens Plan Pulse prednisone  Qvar 40 two puff bid Azithromycin x 5 days ??xolair??      Updated Medication List Outpatient  Encounter Prescriptions as of 02/03/2013  Medication Sig Dispense Refill  . cholecalciferol (VITAMIN D) 1000 UNITS tablet Take 1,000 Units by mouth daily.      Marland Kitchen azithromycin (ZITHROMAX) 250 MG tablet Take 1 tablet (250 mg total) by mouth daily. Take two once then one daily until gone  6 each  0  . beclomethasone (QVAR) 40 MCG/ACT inhaler Inhale 2 puffs into the lungs 2 (two) times daily.  1 Inhaler  12  . predniSONE (DELTASONE) 10 MG tablet Take 4 tablets daily for 5 days then stop  20 tablet  0  . [DISCONTINUED] Vitamin D, Ergocalciferol, (DRISDOL) 50000 UNITS CAPS TAKE ONE CAPSULE BY MOUTH WEEKLY  12 capsule  0   No facility-administered encounter medications on file as of 02/03/2013.

## 2013-02-03 NOTE — Patient Instructions (Signed)
Prednisone 10mg  Take 4 tablets daily for 5 days then stop Azithromycin 250mg  Take two once then one daily until gone Qvar two puff twice daily Labs today Return 2 months

## 2013-02-04 ENCOUNTER — Telehealth: Payer: Self-pay | Admitting: Critical Care Medicine

## 2013-02-04 LAB — ALLERGY FULL PROFILE
Allergen, D pternoyssinus,d7: <0.1 kU/L
Allergen,Goose feathers, e70: <0.1 kU/L
Alternaria Alternata: <0.1 kU/L
Aspergillus fumigatus, m3: <0.1 kU/L
Bahia Grass: 0.46 kU/L — ABNORMAL HIGH
Box Elder IgE: <0.1 kU/L
Cat Dander: 0.37 kU/L — ABNORMAL HIGH
Common Ragweed: 7.03 kU/L — ABNORMAL HIGH
Curvularia lunata: <0.1 kU/L
D. farinae: <0.1 kU/L
Dog Dander: 0.4 kU/L — ABNORMAL HIGH
G005 Rye, Perennial: 1.13 kU/L — ABNORMAL HIGH
Goldenrod: 0.27 kU/L — ABNORMAL HIGH
Helminthosporium halodes: <0.1 kU/L
House Dust Hollister: 0.1 kU/L — ABNORMAL HIGH
Lamb's Quarters: <0.1 kU/L
Plantain: 0.11 kU/L — ABNORMAL HIGH
Stemphylium Botryosum: <0.1 kU/L
Sycamore Tree: <0.1 kU/L

## 2013-02-04 NOTE — Telephone Encounter (Signed)
I will call pt re pos allergy testing results

## 2013-02-04 NOTE — Assessment & Plan Note (Addendum)
Severe persistent asthma with ongoing airway inflammation and not on controller therapy Note IgE >300 with multiple pos positive allergens Plan Pulse prednisone  Qvar 40 two puff bid Azithromycin x 5 days ??xolair??

## 2013-02-06 ENCOUNTER — Telehealth: Payer: Self-pay | Admitting: Critical Care Medicine

## 2013-02-06 NOTE — Telephone Encounter (Signed)
Pt returning dr wright's call can be reached at [phone].Matthew Valdez

## 2013-02-06 NOTE — Telephone Encounter (Signed)
I spoke with pt. I advised him Dr. Delford Field will return to the office tomorrow. He voiced his understanding. Will forward to Dr. Delford Field

## 2013-02-06 NOTE — Telephone Encounter (Signed)
Pt returned PW's call and can be reached @ [phone]. He stated he will be in class today and for [place] to call between 10:30-11:00 if possible. Leanora Ivanoff

## 2013-02-06 NOTE — Telephone Encounter (Signed)
This is a duplicate I will close and continue in previous phone note 02/04/13

## 2013-02-06 NOTE — Telephone Encounter (Signed)
Pt returned PW's call and can be reached @ [phone]. He stated he will be in class today and for us to call between 10:30-11:00 if possible. Emily E McAlister  

## 2013-04-02 ENCOUNTER — Encounter: Payer: Self-pay | Admitting: Internal Medicine

## 2013-04-02 ENCOUNTER — Ambulatory Visit (INDEPENDENT_AMBULATORY_CARE_PROVIDER_SITE_OTHER): Payer: BC Managed Care – PPO | Admitting: Internal Medicine

## 2013-04-02 ENCOUNTER — Ambulatory Visit (INDEPENDENT_AMBULATORY_CARE_PROVIDER_SITE_OTHER)
Admission: RE | Admit: 2013-04-02 | Discharge: 2013-04-02 | Disposition: A | Discharge status: Home / Self Care | Payer: BC Managed Care – PPO | Source: Ambulatory Visit | Attending: Internal Medicine | Admitting: Internal Medicine

## 2013-04-02 ENCOUNTER — Telehealth: Payer: Self-pay | Admitting: Critical Care Medicine

## 2013-04-02 VITALS — BP 114/78 | HR 86 | Temp 98.8°F | Ht 72.0 in | Wt 190.0 lb

## 2013-04-02 DIAGNOSIS — R0600 Dyspnea, unspecified: Secondary | ICD-10-CM

## 2013-04-02 DIAGNOSIS — R079 Chest pain, unspecified: Secondary | ICD-10-CM

## 2013-04-02 DIAGNOSIS — J45901 Unspecified asthma with (acute) exacerbation: Secondary | ICD-10-CM

## 2013-04-02 DIAGNOSIS — R0609 Other forms of dyspnea: Secondary | ICD-10-CM

## 2013-04-02 DIAGNOSIS — J4551 Severe persistent asthma with (acute) exacerbation: Secondary | ICD-10-CM

## 2013-04-02 DIAGNOSIS — R0789 Other chest pain: Secondary | ICD-10-CM

## 2013-04-02 MED ORDER — PREDNISONE (PAK) 10 MG PO TABS: ORAL_TABLET | ORAL | Status: DC

## 2013-04-02 NOTE — Assessment & Plan Note (Signed)
No evidence of active asthma - note he doesn't use his qvar consistently and has no saba - for now rec he take the qvar regularly until he regroups with Dr Delford Field 12/16 - if he does have asthma that is truly refractory, then ics/laba would be next step thought I might consider a methacholine challenge first.

## 2013-04-02 NOTE — Progress Notes (Signed)
Subjective:    Patient ID: Matthew Valdez, male    DOB: 05/06/1990, 22 y.o.   MRN: [id number]  HPI  22 yowm never smoker   08/22/2012 No real wheeze.  Notes some cough.  Notes some nasal drainage.  No real dyspnea. Anxiety is better.  4 more classes. PT assistant program.   02/03/2013 Chief Complaint  Patient presents with  . Follow-up    Breathing a little worse since last OV.  Has good days and bad days.  Has SOB mostly with exertion, chest tightness at times and prod cough with brown mucus.  Pt notes more tightness in chest .  Notes more mucus. Yellow to brown. Not real qhs dyspnea.  1 Pm.  (11-3pm) No real pndrip.  Notes some blowing of nose rec Prednisone 10mg  Take 4 tablets daily for 5 days then stop Azithromycin 250mg  Take two once then one daily until gone> yellow mucus Qvar two puff twice daily  04/02/2013 f/u ov/Matthew Valdez re: chest tightness indolent onset progressively  worse and more localized than before  Chief Complaint  Patient presents with  . Acute Visit    Pt c/o left side CP (dull) x 2 wks- progressively worsening esp upon deep inspiration.  He also c/o dizziness and SOB off and on.   Good days and bad days s pattern, on a good day was running up to a mile in 8 min until 2 weeks prior to OV, prednisone seemed to help in Oct but "my inflammation is back".   Cough with deep breathing breath L chest, no change in sputum, usually white and min prod Not using qvar at all, not using any saba's- discomfort better in supine position ? Have you had it before ?  "I'm not sure"  Mom thinks it's more of the same as he's had before.   No obvious pattern in day to day or daytime variabilty or assoc subjective wheeze overt sinus or hb symptoms. No unusual exp hx or h/o childhood pna/ asthma or knowledge of premature birth.  Sleeping ok without nocturnal  or early am exacerbation  of respiratory  c/o's or need for noct saba. Also denies any obvious fluctuation of symptoms with  weather or environmental changes or other aggravating or alleviating factors except as outlined above   Current Medications, Allergies, Complete Past Medical History, Past Surgical History, Family History, and Social History were reviewed in [employer] record.  ROS  The following are not active complaints unless bolded sore throat, dysphagia, dental problems, itching, sneezing,  nasal congestion or excess/ purulent secretions, ear ache,   fever, chills, sweats, unintended wt loss, exertional cp, hemoptysis,  orthopnea pnd or leg swelling, presyncope, palpitations, heartburn, abdominal pain, anorexia, nausea, vomiting, diarrhea  or change in bowel or urinary habits, change in stools or urine, dysuria,hematuria,  rash, arthralgias, visual complaints, headache, numbness weakness or ataxia or problems with walking or coordination,  change in mood/affect or memory.                 Objective:   Physical Exam  Wt Readings from Last 3 Encounters:  04/02/13 190 lb (86.183 kg)  02/03/13 185 lb (83.915 kg)  11/26/12 186 lb (84.369 kg)     Gen:   well-nourished, in no distress,  Very unusual affect, somewhat evasive with questions "it just hurts" "it feels inflammed" , mod hoarse phonation    ENT: No lesions,  mouth clear,  oropharynx clear, no postnasal drip  Neck: No JVD,  no TMG, no carotid bruits  Lungs: No use of accessory muscles, no dullness to percussion,  Distant bs s wheeze  Cardiovascular: RRR, heart sounds normal, no murmur or gallops, no peripheral edema  Abdomen: soft and NT, no HSM,  BS normal  Musculoskeletal: No deformities, no cyanosis or clubbing  Neuro: alert, non focal  Skin: Warm, no lesions or rashes     Cleda Daub 02/04/2013  Mild restriction  CXR  04/02/2013 : Chronic pulmonary scarring and postoperative changes but no acute Abnormality. Very large amt on visceral  air under L HD  ? Stomach and colon  ekg 04/02/2013  Ok       Assessment & Plan:

## 2013-04-02 NOTE — Assessment & Plan Note (Signed)
New localized pain develped 03/2013 assoc with lots of visceral air on cxr 04/02/2013     subdiaphragmatic pain pattern suggests ibs:  Stereotypical,   very limited distribution of pain locations, daytime, not exacerbated by ex   worse in sitting position, better  supine due to the dome effect of the diaphragm is  canceled in that position. Frequently these patients have had multiple negative GI workups and CT chest scans.  Treatment consists of avoiding foods that cause gas (especially beans and raw vegetables like spinach and salads)  and citrucel 1 heaping tsp twice daily with a large glass of water.  Pain should improve w/in 2 weeks and if not then consider further GI work up- however, this pt and his mother want to discuss his management with Dr Delford Field before making any changes so ok ok to defer rx to Dr Delford Field for f/u 04/08/13

## 2013-04-02 NOTE — Patient Instructions (Addendum)
Prednisone 10 mg take  4 each am x 2 days,  2 each am x 2 days,  1 each am x 2 days and stop   Qvar 40 Take 2 puffs first thing in am and then another 2 puffs about 12 hours later automatically until you see Dr Delford Field to get an idea if it's helping you  Please remember to go to the x-ray department downstairs for your tests - we will call you with the results when they are available.

## 2013-04-02 NOTE — Telephone Encounter (Signed)
Called spoke with patient's mother Alvis Lemmings who reported that pt is c/o heaviness in chest x2 weeks.  Denies pt having any increased SOB, wheezing, increased cough.  Mother requesting appt today or tomorrow.  PW nightfloat this week.  MW with openings this afternoon -- scheduled for 4pm today.  Nothing further needed; will sign off.

## 2013-04-02 NOTE — Assessment & Plan Note (Addendum)
-   04/02/2013  Walked RA x 3 laps @ 185 ft each stopped due to end of study sats 93%    pft's do not show sign restriction or obst and Symptoms are markedly disproportionate to objective findings and not clear this is even  a lung problem but pt does appear to have difficult airway management issues.   ? Anxiety > typically a dx of exclusion but note panic disorder on problem list and exceptionally difficult hx from pt. Since felt better p last course prednisone rec 6 days only then regroup with Dr Delford Field.

## 2013-04-03 NOTE — Progress Notes (Signed)
Quick Note:  LMTCB ______ 

## 2013-04-03 NOTE — Progress Notes (Signed)
Quick Note:  Spoke with pt and notified of results per Dr. Wert. Pt verbalized understanding and denied any questions.  ______ 

## 2013-04-08 ENCOUNTER — Encounter: Payer: Self-pay | Admitting: Critical Care Medicine

## 2013-04-08 ENCOUNTER — Ambulatory Visit (INDEPENDENT_AMBULATORY_CARE_PROVIDER_SITE_OTHER): Payer: BC Managed Care – PPO | Admitting: Critical Care Medicine

## 2013-04-08 VITALS — BP 122/68 | HR 70 | Temp 97.9°F | Ht 72.0 in | Wt 188.0 lb

## 2013-04-08 DIAGNOSIS — R0600 Dyspnea, unspecified: Secondary | ICD-10-CM

## 2013-04-08 DIAGNOSIS — R0789 Other chest pain: Secondary | ICD-10-CM

## 2013-04-08 DIAGNOSIS — K219 Gastro-esophageal reflux disease without esophagitis: Secondary | ICD-10-CM

## 2013-04-08 DIAGNOSIS — J45901 Unspecified asthma with (acute) exacerbation: Secondary | ICD-10-CM

## 2013-04-08 DIAGNOSIS — J4551 Severe persistent asthma with (acute) exacerbation: Secondary | ICD-10-CM

## 2013-04-08 DIAGNOSIS — R0609 Other forms of dyspnea: Secondary | ICD-10-CM

## 2013-04-08 MED ORDER — OMEPRAZOLE 20 MG PO CPDR: 20.0000 mg | DELAYED_RELEASE_CAPSULE | Freq: Every day | ORAL | Status: DC

## 2013-04-08 NOTE — Assessment & Plan Note (Signed)
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

## 2013-04-08 NOTE — Patient Instructions (Signed)
Take omeprazole one daily , take 1/2 hour before a meal then eat Follow reflux diet as best as possible Stop qvar Do not fill/take prednisone Suggest psychology/therapy evaluation again for anxiety, call Dr Kathi Der office if you dont remember your therapists name I stomach issues continue we or dr Christell Constant can refer you to a Gastroenterology MD Return 4 months

## 2013-04-08 NOTE — Progress Notes (Signed)
Subjective:    Patient ID: Matthew Valdez, male    DOB: 1990/09/19, 22 y.o.   MRN: [id number]  HPI 51 yowm never smoker  04/08/2013 f/u ov/Wert re: chest tightness indolent onset progressively  worse and more localized than before  Chief Complaint  Patient presents with  . Follow-up    1 week f/u - Chest tightness and heaviness- Dull pain comes and goes in center chest - Increased heart rate for past month - Not much change since starting Qvar - Did not take Prednisone  Good days and bad days s pattern, on a good day was running up to a mile in 8 min until 2 weeks prior to OV, prednisone seemed to help in Oct but "my inflammation is back".   Cough with deep breathing breath L chest, no change in sputum, usually white and min prod Not using qvar at all, not using any saba's- discomfort better in supine position ? Have you had it before ?  "I'm not sure"  Mom thinks it's more of the same as he's had before.   04/08/2013 Chief Complaint  Patient presents with  . Follow-up    1 week f/u - Chest tightness and heaviness- Dull pain comes and goes in center chest - Increased heart rate for past month - Not much change since starting Qvar - Did not take Prednisone   Pt notes tightness 24 seven.  Pain near sternum, dull pain then went away.  Comes and goes.  Some nausea in the am. A snack brings on the meal.  Pt will belch and burp a lot.  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  Mostly tightness, not that dyspneic.  Min mucus in the am.  Notes some discomfort just before goes to bed. Eats a lot of fast food.     Review of Systems Constitutional:   No  weight loss, night sweats,  Fevers, chills, fatigue, lassitude. HEENT:   No headaches,  Difficulty swallowing,  Tooth/dental problems,  Sore throat,                No sneezing, itching, ear ache, nasal congestion, post nasal drip,   CV:  +++chest pain,  Orthopnea, PND, swelling in lower extremities, anasarca, dizziness,  palpitations  GI  +++ heartburn, indigestion, +++abdominal pain, +++nausea, vomiting, diarrhea, change in bowel habits, loss of appetite  Resp: No shortness of breath with exertion or at rest.  No excess mucus, no productive cough,  No non-productive cough,  No coughing up of blood.  No change in color of mucus.  No wheezing.  No chest wall deformity  Skin: no rash or lesions.  GU: no dysuria, change in color of urine, no urgency or frequency.  No flank pain.  MS:  No joint pain or swelling.  No decreased range of motion.  No back pain.  Psych:  No change in mood or affect. No depression or anxiety.  No memory loss.     Objective:   Physical Exam Filed Vitals:   04/08/13 0917  BP: 122/68  Pulse: 70  Temp: 97.9 F (36.6 C)  TempSrc: Oral  Height: 6' (1.829 m)  Weight: 188 lb (85.276 kg)  SpO2: 96%    Gen: Pleasant, well-nourished, in no distress,  normal affect  ENT: No lesions,  mouth clear,  oropharynx clear, no postnasal drip  Neck: No JVD, no TMG, no carotid bruits  Lungs: No use of accessory muscles, no dullness to percussion, clear without rales  or rhonchi  Cardiovascular: RRR, heart sounds normal, no murmur or gallops, no peripheral edema  Abdomen: soft , tender epigastric area No HSM,  BS normal  Musculoskeletal: No deformities, no cyanosis or clubbing  Neuro: alert, non focal  Skin: Warm, no lesions or rashes  No results found.        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 cholecalciferol (VITAMIN D) 1000 UNITS tablet Take 1,000 Units by mouth daily.  . [DISCONTINUED] beclomethasone (QVAR) 40 MCG/ACT inhaler Inhale 2 puffs into the lungs 2 (two) times daily as needed.  Marland Kitchen omeprazole (PRILOSEC) 20 MG capsule Take 1 capsule (20 mg total) by mouth daily.  . [DISCONTINUED] predniSONE (STERAPRED UNI-PAK) 10 MG tablet Prednisone 10 mg take  4 each am x 2 days,   2 each am x 2 days,  1 each am x2days and stop

## 2013-04-08 NOTE — Assessment & Plan Note (Signed)
High level reflux disease with chest discomfort and abdominal pain without specific primary lung issues Plan Take omeprazole one daily , take 1/2 hour before a meal then eat Follow reflux diet as best as possible If symptoms continue a referral to gastroenterology would be considered Return 4 months

## 2013-04-08 NOTE — Assessment & Plan Note (Signed)
V. 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

## 2013-06-11 ENCOUNTER — Ambulatory Visit (INDEPENDENT_AMBULATORY_CARE_PROVIDER_SITE_OTHER): Payer: BC Managed Care – PPO | Admitting: Family Medicine

## 2013-06-11 ENCOUNTER — Encounter: Payer: Self-pay | Admitting: Family Medicine

## 2013-06-11 VITALS — BP 134/81 | HR 78 | Temp 97.2°F | Ht <= 58 in | Wt 188.0 lb

## 2013-06-11 DIAGNOSIS — Z8709 Personal history of other diseases of the respiratory system: Secondary | ICD-10-CM

## 2013-06-11 DIAGNOSIS — K219 Gastro-esophageal reflux disease without esophagitis: Secondary | ICD-10-CM

## 2013-06-11 DIAGNOSIS — Z0000 Encounter for general adult medical examination without abnormal findings: Secondary | ICD-10-CM

## 2013-06-11 DIAGNOSIS — F411 Generalized anxiety disorder: Secondary | ICD-10-CM

## 2013-06-11 LAB — POCT CBC
Granulocyte percent: 74.1 %G (ref 37–80)
HCT, POC: 49.7 % (ref 43.5–53.7)
Hemoglobin: 16.2 g/dL (ref 14.1–18.1)
LYMPH, POC: 1.5 (ref 0.6–3.4)
MCH: 30 pg (ref 27–31.2)
MCHC: 32.7 g/dL (ref 31.8–35.4)
MCV: 91.9 fL (ref 80–97)
MPV: 8.3 fL (ref 0–99.8)
POC Granulocyte: 5.1 (ref 2–6.9)
POC LYMPH %: 21.5 % (ref 10–50)
Platelet Count, POC: 216 10*3/uL (ref 142–424)
RBC: 5.4 M/uL (ref 4.69–6.13)
RDW, POC: 12.3 %
WBC: 6.9 10*3/uL (ref 4.6–10.2)

## 2013-06-11 LAB — POCT URINALYSIS DIPSTICK
BILIRUBIN UA: NEGATIVE
Blood, UA: NEGATIVE
Glucose, UA: NEGATIVE
Ketones, UA: NEGATIVE
LEUKOCYTES UA: NEGATIVE
NITRITE UA: NEGATIVE
PH UA: 6
Spec Grav, UA: 1.015
Urobilinogen, UA: NEGATIVE

## 2013-06-11 LAB — POCT UA - MICROSCOPIC ONLY
CASTS, UR, LPF, POC: NEGATIVE
Crystals, Ur, HPF, POC: NEGATIVE
Epithelial cells, urine per micros: NEGATIVE
RBC, urine, microscopic: NEGATIVE
Yeast, UA: NEGATIVE

## 2013-06-11 NOTE — Patient Instructions (Addendum)
Continue current medications. Continue good therapeutic lifestyle changes which include good diet and exercise. Fall precautions discussed with patient. Schedule your flu vaccine if you haven't had it yet If you are over 22 years old - you may need Prevnar 13 or the adult Pneumonia vaccine. Caffeine avoidance Continue to exercise regularly and use relaxation techniques

## 2013-06-11 NOTE — Progress Notes (Signed)
Subjective:    Patient ID: Matthew Valdez, male    DOB: 01-18-1991, 23 y.o.   MRN: [id number]  HPI Patient here today for annual wellness exam. The patient is doing well. He does complain of some tinnitus in his left ear. Respiratory-wise everything is stable.       Patient Active Problem List   Diagnosis Date Noted  . Dyspnea 04/02/2013  . Other chest pain 04/02/2013  . 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 ANXIETY DISORDER 01/26/2010  . PANIC DISORDER 01/26/2010  . RESTRICTIVE LUNG DISEASE 01/26/2010  . GERD 01/26/2010   Outpatient Encounter Prescriptions as of 06/11/2013  Medication Sig  . cholecalciferol (VITAMIN D) 1000 UNITS tablet Take 1,000 Units by mouth daily.  . [DISCONTINUED] omeprazole (PRILOSEC) 20 MG capsule Take 1 capsule (20 mg total) by mouth daily.    Review of Systems  Constitutional: Negative.   HENT: Negative.        Left ear ringing  Eyes: Negative.   Respiratory: Negative.   Cardiovascular: Negative.   Gastrointestinal: Negative.   Endocrine: Negative.   Genitourinary: Negative.   Musculoskeletal: Negative.   Skin: Negative.   Allergic/Immunologic: Negative.   Neurological: Negative.   Hematological: Negative.   Psychiatric/Behavioral: Negative.        Objective:   Physical Exam  Nursing note and vitals reviewed. Constitutional: He is oriented to person, place, and time. He appears well-developed and well-nourished. No distress.  HENT:  Head: Normocephalic and atraumatic.  Right Ear: External ear normal.  Left Ear: External ear normal.  Nose: Nose normal.  Mouth/Throat: Oropharynx is clear and moist. No oropharyngeal exudate.  Eyes: Conjunctivae and EOM are normal. Pupils are equal, round, and reactive to light. Right eye exhibits no discharge. Left eye exhibits no discharge. No scleral icterus.  Left TM and external auditory canal were normal.  Neck: Normal range of motion. Neck supple. No tracheal deviation present. No thyromegaly present.   Note anterior nodes  Cardiovascular: Normal rate, regular rhythm, normal heart sounds and intact distal pulses.  Exam reveals no gallop and no friction rub.   No murmur heard. Pulmonary/Chest: Effort normal and breath sounds normal. No respiratory distress. He has no wheezes. He has no rales. He exhibits no tenderness.  Abdominal: Soft. Bowel sounds are normal. He exhibits no mass. There is tenderness. There is no rebound and no guarding.  Slight epigastric tenderness  Genitourinary: Penis normal.  The external genitalia were normal and there was no hernia palpated nor inguinal nodes.  Musculoskeletal: Normal range of motion. He exhibits no edema and no tenderness.  Lymphadenopathy:    He has no cervical adenopathy.  Neurological: He is alert and oriented to person, place, and time. He has normal reflexes. No cranial nerve deficit.  Skin: Skin is warm and dry. No rash noted. No erythema. No pallor.  Psychiatric: He has a normal mood and affect. His behavior is normal. Judgment and thought content normal.   BP 134/81  Pulse 78  Temp(Src) 97.2 F (36.2 C) (Oral)  Ht _0  (0.356 m)  Wt 188 lb (85.276 kg)  BMI 672.86 kg/m2        Assessment & Plan:  1. GERD - POCT CBC - Hepatic function panel  2. Annual physical exam - POCT CBC - BMP8+EGFR - Hepatic function panel - Lipid panel - Vit D  25 hydroxy (rtn osteoporosis monitoring) - Thyroid Panel With TSH  3. ARDS survivor  4. ANXIETY DISORDER  Patient Instructions  Continue current medications.  Continue good therapeutic lifestyle changes which include good diet and exercise. Fall precautions discussed with patient. Schedule your flu vaccine if you haven't had it yet If you are over 71 years old - you may need Prevnar 41 or the adult Pneumonia vaccine. Caffeine avoidance Continue to exercise regularly and use relaxation techniques   Arrie Senate MD

## 2013-06-12 ENCOUNTER — Telehealth: Payer: Self-pay | Admitting: Family Medicine

## 2013-06-12 LAB — BMP8+EGFR
BUN/Creatinine Ratio: 13 (ref 8–19)
BUN: 13 mg/dL (ref 6–20)
CALCIUM: 10.1 mg/dL (ref 8.7–10.2)
CHLORIDE: 101 mmol/L (ref 97–108)
CO2: 25 mmol/L (ref 18–29)
Creatinine, Ser: 1.04 mg/dL (ref 0.76–1.27)
GFR calc Af Amer: 118 mL/min/{1.73_m2} (ref >59)
GFR calc non Af Amer: 102 mL/min/{1.73_m2} (ref >59)
Glucose: 71 mg/dL (ref 65–99)
POTASSIUM: 4.6 mmol/L (ref 3.5–5.2)
SODIUM: 141 mmol/L (ref 134–144)

## 2013-06-12 LAB — LIPID PANEL
CHOLESTEROL TOTAL: 185 mg/dL (ref 100–189)
Chol/HDL Ratio: 3.9 ratio units (ref 0.0–5.0)
HDL: 48 mg/dL (ref >39)
LDL Calculated: 86 mg/dL (ref 0–119)
TRIGLYCERIDES: 257 mg/dL — AB (ref 0–114)
VLDL CHOLESTEROL CAL: 51 mg/dL — AB (ref 5–40)

## 2013-06-12 LAB — THYROID PANEL WITH TSH
FREE THYROXINE INDEX: 2.6 (ref 1.2–4.9)
T3 Uptake Ratio: 34 % (ref 24–39)
T4, Total: 7.5 ug/dL (ref 4.5–12.0)
TSH: 1.55 u[IU]/mL (ref 0.450–4.500)

## 2013-06-12 LAB — HEPATIC FUNCTION PANEL
ALT: 24 IU/L (ref 0–44)
AST: 24 IU/L (ref 0–40)
Albumin: 5.2 g/dL (ref 3.5–5.5)
Alkaline Phosphatase: 47 IU/L (ref 39–117)
Bilirubin, Direct: 0.19 mg/dL (ref 0.00–0.40)
Total Bilirubin: 0.8 mg/dL (ref 0.0–1.2)
Total Protein: 7.4 g/dL (ref 6.0–8.5)

## 2013-06-12 LAB — VITAMIN D 25 HYDROXY (VIT D DEFICIENCY, FRACTURES): Vit D, 25-Hydroxy: 25.8 ng/mL — ABNORMAL LOW (ref 30.0–100.0)

## 2013-06-12 NOTE — Telephone Encounter (Signed)
Message copied by Azalee CourseFULP, Orin Eberwein on Thu Jun 12, 2013  8:31 AM ------      Message from: Ernestina PennaMOORE, DONALD W      Created: Thu Jun 12, 2013  8:03 AM       Blood sugar renal and electrolytes are within normal limits      Liver function tests are within normal limits      On A traditional lipid panel nonfasting the LDL C. is good and at goal      The vitamin D level is low. Call a prescription in for vitamin D. 50,000 units one weekly #12 with one refill. Have patient repeat his vitamin D level in 3 months.++++++      All thyroid function tests are within normal limits ------

## 2013-06-12 NOTE — Telephone Encounter (Signed)
Pt aware of lab results and will pick up his presc. For vit d.  He did make appt for 08/12/13 to reck vit d.  rs

## 2013-09-11 ENCOUNTER — Other Ambulatory Visit: Payer: BC Managed Care – PPO

## 2013-10-15 ENCOUNTER — Other Ambulatory Visit: Payer: Self-pay | Admitting: Family Medicine

## 2013-10-16 NOTE — Telephone Encounter (Signed)
Please make sure that patient has a recent vitamin D level and it is okay to refill this until he is able to get one

## 2013-11-20 ENCOUNTER — Encounter: Payer: Self-pay | Admitting: Adult Health

## 2013-11-20 ENCOUNTER — Ambulatory Visit (INDEPENDENT_AMBULATORY_CARE_PROVIDER_SITE_OTHER): Payer: BC Managed Care – PPO | Admitting: Adult Health

## 2013-11-20 ENCOUNTER — Ambulatory Visit (INDEPENDENT_AMBULATORY_CARE_PROVIDER_SITE_OTHER)
Admission: RE | Admit: 2013-11-20 | Discharge: 2013-11-20 | Disposition: A | Discharge status: Home / Self Care | Payer: BC Managed Care – PPO | Source: Ambulatory Visit | Attending: Adult Health | Admitting: Adult Health

## 2013-11-20 VITALS — BP 108/66 | HR 76 | Temp 97.3°F | Ht 72.0 in | Wt 189.6 lb

## 2013-11-20 DIAGNOSIS — J209 Acute bronchitis, unspecified: Secondary | ICD-10-CM

## 2013-11-20 DIAGNOSIS — R042 Hemoptysis: Secondary | ICD-10-CM

## 2013-11-20 DIAGNOSIS — J984 Other disorders of lung: Secondary | ICD-10-CM

## 2013-11-20 MED ORDER — PREDNISONE 10 MG PO TABS: ORAL_TABLET | ORAL | Status: DC

## 2013-11-20 MED ORDER — AZITHROMYCIN 250 MG PO TABS: ORAL_TABLET | ORAL | Status: AC

## 2013-11-20 NOTE — Progress Notes (Signed)
   Subjective:    Patient ID: Matthew Valdez, male    DOB: 12/26/1990, 23 y.o.   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 Mild restrictive changes on Spirometry .    11/20/2013 Acute OV  Complains of increased SOB, tightnessworse with exertion, HA, wheezing, occasional chills, some prod cough with brown/yellow/dark red mucus x1 month.   Worse cough over last  1 week.   Denies fever, sweats, n/v/d, recent travel or abx use.  CXR today shows chronic changes w/ no acute process.  Pt is accompanied by his mother, they are concerned that his activity tolerance as declined in last year..  Does get winded with exercise at times.     Review of Systems Constitutional:   No  weight loss, night sweats,  Fevers, chills,  +fatigue, or  lassitude.  HEENT:   No headaches,  Difficulty swallowing,  Tooth/dental problems, or  Sore throat,                No sneezing, itching, ear ache,  +nasal congestion, post nasal drip,   CV:  No chest pain,  Orthopnea, PND, swelling in lower extremities, anasarca, dizziness, palpitations, syncope.   GI  No heartburn, indigestion, abdominal pain, nausea, vomiting, diarrhea, change in bowel habits, loss of appetite, bloody stools.   Resp:    No chest wall deformity  Skin: no rash or lesions.  GU: no dysuria, change in color of urine, no urgency or frequency.  No flank pain, no hematuria   MS:  No joint pain or swelling.  No decreased range of motion.  No back pain.  Psych:  No change in mood or affect. No depression or anxiety.  No memory loss.          Objective:   Physical Exam GEN: A/Ox3; pleasant , NAD, well nourished   HEENT:  [place]/AT,  EACs-clear, TMs-wnl, NOSE-clear, THROAT-clear, no lesions, no postnasal drip or exudate noted.   NECK:  Supple w/ fair ROM; no JVD; normal carotid impulses w/o bruits; no thyromegaly or nodules palpated; no lymphadenopathy, healed trach scar   RESP   Clear  P & A; w/o, wheezes/ rales/ or rhonchi.no accessory muscle use, no dullness to percussion  CARD:  RRR, no m/r/g  , no peripheral edema, pulses intact, no cyanosis or clubbing.  GI:   Soft & nt; nml bowel sounds; no organomegaly or masses detected.  Musco: Warm bil, no deformities or joint swelling noted.   Neuro: alert, no focal deficits noted.    Skin: Warm, no lesions or rashes         Assessment & Plan:

## 2013-11-20 NOTE — Patient Instructions (Addendum)
Z-Pak, take as directed Prednisone taper. Over the next week. Mucinex DM twice daily as needed for cough and congestion. Allegra 180 mg daily as needed. For drainage. Please contact office for sooner follow up if symptoms do not improve or worsen or seek emergency care  Follow up Dr. Delford FieldWright  In 6 weeks with PFT and As needed

## 2013-11-20 NOTE — Assessment & Plan Note (Signed)
Acute bronchitis  cxr w/out acute process   Plan  Z-Pak, take as directed Prednisone taper. Over the next week. Mucinex DM twice daily as needed for cough and congestion. Allegra 180 mg daily as needed. For drainage. Please contact office for sooner follow up if symptoms do not improve or worsen or seek emergency care  Follow up Dr. Delford FieldWright  In 6 weeks with PFT and As needed

## 2013-11-20 NOTE — Assessment & Plan Note (Signed)
Return in 6 weeks with PFT.

## 2013-11-28 ENCOUNTER — Ambulatory Visit: Payer: BC Managed Care – PPO | Admitting: Critical Care Medicine

## 2013-12-12 ENCOUNTER — Other Ambulatory Visit: Payer: Self-pay | Admitting: Family Medicine

## 2013-12-30 ENCOUNTER — Other Ambulatory Visit: Payer: Self-pay | Admitting: Family Medicine

## 2014-01-05 ENCOUNTER — Telehealth: Payer: Self-pay | Admitting: *Deleted

## 2014-01-05 DIAGNOSIS — Z0000 Encounter for general adult medical examination without abnormal findings: Secondary | ICD-10-CM

## 2014-01-05 NOTE — Telephone Encounter (Signed)
This patient can get lab work drawn fasting. He should have a lipid liver and vitamin D

## 2014-01-05 NOTE — Telephone Encounter (Signed)
Pt needs vit D level drawn. Does he need other labs at this time or just Vit d level? Can pt just come in and have labs without being seen? Route to nurse pool and notify pt.

## 2014-01-06 NOTE — Telephone Encounter (Signed)
Patient aware orders are in computer

## 2014-01-06 NOTE — Telephone Encounter (Signed)
Matthew Valdez made appt for 01/07/14 8:30 am for fasting labs.  Is order for the fasting labs in for him to come in to have done?? rs

## 2014-01-07 ENCOUNTER — Other Ambulatory Visit (INDEPENDENT_AMBULATORY_CARE_PROVIDER_SITE_OTHER): Payer: BC Managed Care – PPO

## 2014-01-07 DIAGNOSIS — Z0000 Encounter for general adult medical examination without abnormal findings: Secondary | ICD-10-CM

## 2014-01-08 ENCOUNTER — Telehealth: Payer: Self-pay | Admitting: Family Medicine

## 2014-01-08 LAB — HEPATIC FUNCTION PANEL
ALBUMIN: 4.8 g/dL (ref 3.5–5.5)
ALK PHOS: 47 IU/L (ref 39–117)
ALT: 18 IU/L (ref 0–44)
AST: 19 IU/L (ref 0–40)
BILIRUBIN TOTAL: 0.8 mg/dL (ref 0.0–1.2)
Bilirubin, Direct: 0.18 mg/dL (ref 0.00–0.40)
TOTAL PROTEIN: 7.4 g/dL (ref 6.0–8.5)

## 2014-01-08 LAB — BMP8+EGFR
BUN/Creatinine Ratio: 10 (ref 8–19)
BUN: 11 mg/dL (ref 6–20)
CHLORIDE: 101 mmol/L (ref 97–108)
CO2: 25 mmol/L (ref 18–29)
CREATININE: 1.11 mg/dL (ref 0.76–1.27)
Calcium: 9.7 mg/dL (ref 8.7–10.2)
GFR calc Af Amer: 108 mL/min/{1.73_m2} (ref >59)
GFR calc non Af Amer: 94 mL/min/{1.73_m2} (ref >59)
GLUCOSE: 93 mg/dL (ref 65–99)
Potassium: 4 mmol/L (ref 3.5–5.2)
Sodium: 141 mmol/L (ref 134–144)

## 2014-01-08 LAB — VITAMIN D 25 HYDROXY (VIT D DEFICIENCY, FRACTURES): Vit D, 25-Hydroxy: 46.1 ng/mL (ref 30.0–100.0)

## 2014-01-08 LAB — LIPID PANEL
CHOL/HDL RATIO: 4.3 ratio (ref 0.0–5.0)
Cholesterol, Total: 203 mg/dL — ABNORMAL HIGH (ref 100–189)
HDL: 47 mg/dL (ref >39)
LDL CALC: 124 mg/dL — AB (ref 0–119)
TRIGLYCERIDES: 160 mg/dL — AB (ref 0–114)
VLDL Cholesterol Cal: 32 mg/dL (ref 5–40)

## 2014-01-08 NOTE — Telephone Encounter (Signed)
Patient aware.

## 2014-01-08 NOTE — Telephone Encounter (Signed)
Message copied by Azalee Course on Thu Jan 08, 2014 10:02 AM ------      Message from: Ernestina Penna      Created: Thu Jan 08, 2014  7:24 AM       The blood sugar is good at 93. The creatinine the most important kidney function test is within normal limits. All of the electrolytes including potassium were within normal limit      All liver function tests are within normal limit      Cholesterol numbers with traditional lipid testing are elevated. The triglycerides are 160. 7 months ago there were 257. This means that have improved. They are still elevated. The goal for this number should be less than 150. The LDL cholesterol should be less than 100 and it is 124. Encouraged the patient to watch his diet more closely. Please give him an appointment with the clinical pharmacist to discuss diet habits and emphasize physical activity. This education hopefully will be helpful to him to help bring the cholesterol down some more.++++++      The vitamin D level is good at 46.1, continue current treatment ------

## 2014-01-09 ENCOUNTER — Ambulatory Visit (INDEPENDENT_AMBULATORY_CARE_PROVIDER_SITE_OTHER): Payer: BC Managed Care – PPO | Admitting: Critical Care Medicine

## 2014-01-09 ENCOUNTER — Encounter: Payer: Self-pay | Admitting: Critical Care Medicine

## 2014-01-09 VITALS — BP 120/82 | HR 89 | Temp 97.0°F | Ht 71.0 in | Wt 192.2 lb

## 2014-01-09 DIAGNOSIS — J984 Other disorders of lung: Secondary | ICD-10-CM

## 2014-01-09 DIAGNOSIS — Z23 Encounter for immunization: Secondary | ICD-10-CM

## 2014-01-09 DIAGNOSIS — E782 Mixed hyperlipidemia: Secondary | ICD-10-CM

## 2014-01-09 LAB — PULMONARY FUNCTION TEST
DL/VA % pred: 115 %
DL/VA: 5.45 ml/min/mmHg/L
DLCO unc % pred: 88 %
DLCO unc: 29.27 ml/min/mmHg
FEF 25-75 Post: 4.63 L/sec
FEF 25-75 Pre: 4.13 L/sec
FEF2575-%Change-Post: 11 %
FEF2575-%Pred-Post: 94 %
FEF2575-%Pred-Pre: 84 %
FEV1-%CHANGE-POST: 1 %
FEV1-%PRED-POST: 84 %
FEV1-%Pred-Pre: 82 %
FEV1-Post: 3.96 L
FEV1-Pre: 3.89 L
FEV1FVC-%Change-Post: 1 %
FEV1FVC-%PRED-PRE: 103 %
FEV6-%Change-Post: 0 %
FEV6-%Pred-Post: 81 %
FEV6-%Pred-Pre: 80 %
FEV6-POST: 4.57 L
FEV6-Pre: 4.55 L
FEV6FVC-%PRED-PRE: 100 %
FEV6FVC-%Pred-Post: 100 %
FVC-%Change-Post: 0 %
FVC-%PRED-PRE: 80 %
FVC-%Pred-Post: 80 %
FVC-Post: 4.57 L
FVC-Pre: 4.55 L
PRE FEV6/FVC RATIO: 100 %
Post FEV1/FVC ratio: 87 %
Post FEV6/FVC ratio: 100 %
Pre FEV1/FVC ratio: 86 %
RV % PRED: 88 %
RV: 1.32 L
TLC % PRED: 87 %
TLC: 6.09 L

## 2014-01-09 MED ORDER — MOMETASONE FUROATE 50 MCG/ACT NA SUSP: 2.0000 | Freq: Every day | NASAL | Status: DC

## 2014-01-09 NOTE — Progress Notes (Signed)
PFT done today. 

## 2014-01-09 NOTE — Patient Instructions (Signed)
Flu vaccine and pneumovax was given Use nasonex two puff ea nostril daily until sample gone Focus on low carbohydrate, lean meats/vegetables, continue exercise program Return 6 months

## 2014-01-09 NOTE — Progress Notes (Signed)
Subjective:    Patient ID: Matthew Valdez, male    DOB: [birth year], 23 y.o.   MRN: [id number]  HPI 01/09/2014 Chief Complaint  Patient presents with  . Follow-up    with PFTs.  Breathing has improved.  Has cough with yellowish green mucus, PND, and sneezing x 2-3 wks.  No SOB, chest tightness/pain, or wheezing.  Overall the patient is doing well. There is some postnasal drainage and sneezing for the past several weeks. There is no dyspnea chest tightness pain or wheezing.    Review of Systems Constitutional:   No  weight loss, night sweats,  Fevers, chills, fatigue, lassitude. HEENT:   No headaches,  Difficulty swallowing,  Tooth/dental problems,  Sore throat,                No sneezing, itching, ear ache, nasal congestion, post nasal drip,   CV:  No chest pain,  Orthopnea, PND, swelling in lower extremities, anasarca, dizziness, palpitations  GI  No heartburn, indigestion, abdominal pain, nausea, vomiting, diarrhea, change in bowel habits, loss of appetite  Resp: No shortness of breath with exertion or at rest.  No excess mucus, no productive cough,  No non-productive cough,  No coughing up of blood.  No change in color of mucus.  No wheezing.  No chest wall deformity  Skin: no rash or lesions.  GU: no dysuria, change in color of urine, no urgency or frequency.  No flank pain.  MS:  No joint pain or swelling.  No decreased range of motion.  No back pain.  Psych:  No change in mood or affect. No depression or anxiety.  No memory loss.     Objective:   Physical Exam Filed Vitals:   01/09/14 0953  BP: 120/82  Pulse: 89  Temp: 97 F (36.1 C)  TempSrc: Oral  Height:  (1.803 m)  Weight: 192 lb 3.2 oz (87.181 kg)  SpO2: 96%    Gen: Pleasant, well-nourished, in no distress,  normal affect  ENT: No lesions,  mouth clear,  oropharynx clear, no postnasal drip  Neck: No JVD, no TMG, no carotid bruits  Lungs: No use of accessory muscles, no dullness to percussion,  clear without rales or rhonchi  Cardiovascular: RRR, heart sounds normal, no murmur or gallops, no peripheral edema  Abdomen: soft and NT, no HSM,  BS normal  Musculoskeletal: No deformities, no cyanosis or clubbing  Neuro: alert, non focal  Skin: Warm, no lesions or rashes  No results found. Pulmonary functions from 01/09/2014 are reviewed and showed marked improvement in lung capacity and diffusion    Assessment & Plan:   Mixed hypercholesterolemia and hypertriglyceridemia Patient was elevated LDL, cholesterol total and triglycerides. 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 Note pulmonary function studies on 01/09/2014 are markedly improved Plan The patient was given samples of Nasonex for nasal inflammation No additional medication changes are indicated   Updated Medication List Outpatient Encounter Prescriptions as of 01/09/2014  Medication Sig  . fexofenadine (ALLEGRA) 180 MG tablet Take 180 mg by mouth daily.  . Vitamin D, Ergocalciferol, (DRISDOL) 50000 UNITS CAPS capsule TAKE ONE CAPSULE BY MOUTH WEEKLY  . mometasone (NASONEX) 50 MCG/ACT nasal spray Place 2 sprays into the nose daily.  . [DISCONTINUED] predniSONE (DELTASONE) 10 MG tablet 4 tabs for 2 days,  then 3 tabs for 2 days, 2 tabs for 2 days, then 1 tab for 2 days, then stop

## 2014-01-10 DIAGNOSIS — E782 Mixed hyperlipidemia: Secondary | ICD-10-CM | POA: Insufficient documentation

## 2014-01-10 NOTE — Assessment & Plan Note (Signed)
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 Note pulmonary function studies on 01/09/2014 are markedly improved Plan The patient was given samples of Nasonex for nasal inflammation No additional medication changes are indicated

## 2014-01-10 NOTE — Assessment & Plan Note (Signed)
Patient was elevated LDL, cholesterol total and triglycerides. Patient was given a dietary regimen and exercise regimen No medication therapy indicated

## 2014-01-12 ENCOUNTER — Ambulatory Visit: Payer: BC Managed Care – PPO

## 2014-01-14 ENCOUNTER — Other Ambulatory Visit: Payer: Self-pay | Admitting: *Deleted

## 2014-01-14 MED ORDER — VITAMIN D (ERGOCALCIFEROL) 1.25 MG (50000 UNIT) PO CAPS: ORAL_CAPSULE | ORAL | Status: DC

## 2014-01-14 NOTE — Telephone Encounter (Signed)
Last Vit D level was 41.2 on 01/07/14

## 2014-01-21 ENCOUNTER — Other Ambulatory Visit: Payer: Self-pay | Admitting: Physician Assistant

## 2014-04-08 ENCOUNTER — Other Ambulatory Visit: Payer: Self-pay | Admitting: Family Medicine

## 2014-04-08 NOTE — Telephone Encounter (Signed)
Last seen 06/11/13 DWM  Last Vit D level 01/07/14  46.1 normal

## 2014-06-02 ENCOUNTER — Ambulatory Visit (INDEPENDENT_AMBULATORY_CARE_PROVIDER_SITE_OTHER): Payer: BLUE CROSS/BLUE SHIELD | Admitting: Family Medicine

## 2014-06-02 ENCOUNTER — Encounter: Payer: Self-pay | Admitting: Family Medicine

## 2014-06-02 VITALS — BP 120/78 | HR 70 | Temp 96.8°F | Ht 71.0 in | Wt 186.0 lb

## 2014-06-02 DIAGNOSIS — R0989 Other specified symptoms and signs involving the circulatory and respiratory systems: Secondary | ICD-10-CM

## 2014-06-02 DIAGNOSIS — J329 Chronic sinusitis, unspecified: Secondary | ICD-10-CM

## 2014-06-02 DIAGNOSIS — R059 Cough, unspecified: Secondary | ICD-10-CM

## 2014-06-02 DIAGNOSIS — R05 Cough: Secondary | ICD-10-CM

## 2014-06-02 LAB — POCT CBC
GRANULOCYTE PERCENT: 75.7 % (ref 37–80)
HCT, POC: 50.6 % (ref 43.5–53.7)
Hemoglobin: 16 g/dL (ref 14.1–18.1)
Lymph, poc: 2.1 (ref 0.6–3.4)
MCH, POC: 29 pg (ref 27–31.2)
MCHC: 31.7 g/dL — AB (ref 31.8–35.4)
MCV: 91.6 fL (ref 80–97)
MPV: 7.8 fL (ref 0–99.8)
PLATELET COUNT, POC: 285 10*3/uL (ref 142–424)
POC GRANULOCYTE: 7 — AB (ref 2–6.9)
POC LYMPH PERCENT: 22.7 %L (ref 10–50)
RBC: 5.5 M/uL (ref 4.69–6.13)
RDW, POC: 13.1 %
WBC: 9.2 10*3/uL (ref 4.6–10.2)

## 2014-06-02 LAB — POCT RAPID STREP A (OFFICE): Rapid Strep A Screen: NEGATIVE

## 2014-06-02 MED ORDER — FLUTICASONE PROPIONATE 50 MCG/ACT NA SUSP: NASAL | Status: DC

## 2014-06-02 MED ORDER — AMOXICILLIN 500 MG PO CAPS: 500.0000 mg | ORAL_CAPSULE | Freq: Three times a day (TID) | ORAL | Status: DC

## 2014-06-02 NOTE — Addendum Note (Signed)
Addended by: Prescott GumLAND, Romir Klimowicz M on: 06/02/2014 03:52 PM   Modules accepted: Orders, SmartSet

## 2014-06-02 NOTE — Patient Instructions (Addendum)
Continue to take Mucinex twice daily with a large glass of water, also use saline nose spray frequently during the day Take Tylenol for aches pains and fever Take antibiotic as directed and use nose spray as directed Keep house as cool as possible and drink as much fluid as possible and use cool mist humidifier

## 2014-06-02 NOTE — Progress Notes (Signed)
Subjective:    Patient ID: Matthew Valdez, male    DOB: 12/21/1990, 24 y.o.   MRN: [id number]  HPI Patient here today for URI. He has cough, congestion and a scratchy throat. He states this started about a week ago. The patient says he has some chills this morning and for the past couple of days he's had some loose bowel movements. He has some tightness in his right side. The sputum has been sort of a greenish and brown and gray colored.        Patient Active Problem List   Diagnosis Date Noted  . Mixed hypercholesterolemia and hypertriglyceridemia 01/10/2014  . Other chest pain 04/02/2013  . 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 ANXIETY DISORDER 01/26/2010  . PANIC DISORDER 01/26/2010  . RESTRICTIVE LUNG DISEASE 01/26/2010  . GERD 01/26/2010   Outpatient Encounter Prescriptions as of 06/02/2014  Medication Sig  . fexofenadine (ALLEGRA) 180 MG tablet Take 180 mg by mouth daily.  . mometasone (NASONEX) 50 MCG/ACT nasal spray Place 2 sprays into the nose daily.  . Vitamin D, Ergocalciferol, (DRISDOL) 50000 UNITS CAPS capsule TAKE ONE CAPSULE BY MOUTH WEEKLY    Review of Systems  Constitutional: Negative.   HENT: Positive for congestion and sore throat (slight).   Eyes: Negative.   Respiratory: Positive for cough and chest tightness (right side - chest tightness).   Cardiovascular: Negative.   Gastrointestinal: Negative.   Endocrine: Negative.   Genitourinary: Negative.   Musculoskeletal: Negative.   Skin: Negative.   Allergic/Immunologic: Negative.   Neurological: Negative.   Hematological: Negative.   Psychiatric/Behavioral: Negative.        Objective:   Physical Exam  Constitutional: He is oriented to person, place, and time. He appears well-developed and well-nourished. No distress.  HENT:  Head: Normocephalic and atraumatic.  Right Ear: External ear normal.  Left Ear: External ear normal.  Mouth/Throat: Oropharynx is clear and moist. No oropharyngeal exudate.  Nasal  congestion bilaterally  Eyes: Conjunctivae and EOM are normal. Pupils are equal, round, and reactive to light. Right eye exhibits no discharge. Left eye exhibits no discharge. No scleral icterus.  Neck: Normal range of motion. Neck supple. No thyromegaly present.  No anterior cervical adenopathy  Cardiovascular: Normal rate, regular rhythm and normal heart sounds.   No murmur heard. Pulmonary/Chest: Effort normal and breath sounds normal. No respiratory distress. He has no wheezes. He has no rales. He exhibits no tenderness.  Mild bronchial congestion with coughing  Abdominal: Soft. Bowel sounds are normal. He exhibits no mass. There is no tenderness. There is no rebound and no guarding.  Genitourinary: Rectum normal.  Musculoskeletal: Normal range of motion. He exhibits no edema.  Lymphadenopathy:    He has no cervical adenopathy.  Neurological: He is alert and oriented to person, place, and time.  Skin: Skin is warm and dry. No rash noted.  Psychiatric: He has a normal mood and affect. His behavior is normal. Judgment and thought content normal.  Nursing note and vitals reviewed.  BP 138/93 mmHg  Pulse 70  Temp(Src) 96.8 F (36 C) (Oral)  Ht 5\' 11"  (1.803 m)  Wt 186 lb (84.369 kg)  BMI 25.95 kg/m2  Blood pressure recheck--120/78 right arm Results for orders placed or performed in visit on 06/02/14  POCT CBC  Result Value Ref Range   WBC 9.2 4.6 - 10.2 K/uL   Lymph, poc 2.1 0.6 - 3.4   POC LYMPH PERCENT 22.7 10 - 50 %L   POC Granulocyte  7.0 (A) 2 - 6.9   Granulocyte percent 75.7 37 - 80 %G   RBC 5.5 4.69 - 6.13 M/uL   Hemoglobin 16.0 14.1 - 18.1 g/dL   HCT, POC 63.1 49.7 - 53.7 %   MCV 91.6 80 - 97 fL   MCH, POC 29.0 27 - 31.2 pg   MCHC 31.7 (A) 31.8 - 35.4 g/dL   RDW, POC 02.6 %   Platelet Count, POC 285.0 142 - 424 K/uL   MPV 7.8 0 - 99.8 fL  POCT rapid strep A  Result Value Ref Range   Rapid Strep A Screen Negative Negative        Assessment & Plan:  1.  Cough -Take Mucinex regularly with a large glass of water, use a cool mist humidifier and keep the house as cool as possible as well as drink plenty of fluids - POCT CBC - POCT rapid strep A  2. Chest congestion -Take Mucinex as directed - POCT CBC - POCT rapid strep A  3. Rhinosinusitis -Use saline nose spray frequently through the day - amoxicillin (AMOXIL) 500 MG capsule; Take 1 capsule (500 mg total) by mouth 3 (three) times daily.  Dispense: 30 capsule; Refill: 0 - fluticasone (FLONASE) 50 MCG/ACT nasal spray; One to 2 sprays each nostril daily  Dispense: 16 g; Refill: 6  Patient Instructions  Continue to take Mucinex twice daily with a large glass of water, also use saline nose spray frequently during the day Take Tylenol for aches pains and fever Take antibiotic as directed and use nose spray as directed Keep house as cool as possible and drink as much fluid as possible and use cool mist humidifier   Nyra Capes MD

## 2014-06-04 LAB — CULTURE, GROUP A STREP: STREP A CULTURE: NEGATIVE

## 2014-07-08 ENCOUNTER — Other Ambulatory Visit: Payer: Self-pay | Admitting: Family Medicine

## 2014-07-08 NOTE — Telephone Encounter (Signed)
Last seen 06/02/14 DWM  Last Vit D 01/07/14  46.1

## 2014-07-24 ENCOUNTER — Ambulatory Visit (INDEPENDENT_AMBULATORY_CARE_PROVIDER_SITE_OTHER): Payer: BLUE CROSS/BLUE SHIELD | Admitting: *Deleted

## 2014-07-24 DIAGNOSIS — Z111 Encounter for screening for respiratory tuberculosis: Secondary | ICD-10-CM | POA: Diagnosis not present

## 2014-07-24 NOTE — Patient Instructions (Signed)

## 2014-07-24 NOTE — Progress Notes (Signed)
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 TB skin test placed on right forearm and patient will return to the office around 7:45am on Monday to have TB skin test read. Patient tolerated TB skin test well.

## 2014-07-27 ENCOUNTER — Encounter: Payer: Self-pay | Admitting: *Deleted

## 2014-07-27 LAB — TB SKIN TEST
Induration: 0 mm
TB Skin Test: NEGATIVE

## 2014-07-29 ENCOUNTER — Ambulatory Visit (INDEPENDENT_AMBULATORY_CARE_PROVIDER_SITE_OTHER): Payer: BLUE CROSS/BLUE SHIELD | Admitting: Family Medicine

## 2014-07-29 ENCOUNTER — Encounter: Payer: Self-pay | Admitting: Family Medicine

## 2014-07-29 VITALS — BP 130/79 | HR 95 | Temp 96.9°F | Ht 71.0 in | Wt 186.0 lb

## 2014-07-29 DIAGNOSIS — Z0000 Encounter for general adult medical examination without abnormal findings: Secondary | ICD-10-CM

## 2014-07-29 DIAGNOSIS — Z8709 Personal history of other diseases of the respiratory system: Secondary | ICD-10-CM

## 2014-07-29 DIAGNOSIS — R5383 Other fatigue: Secondary | ICD-10-CM | POA: Diagnosis not present

## 2014-07-29 DIAGNOSIS — Z23 Encounter for immunization: Secondary | ICD-10-CM | POA: Diagnosis not present

## 2014-07-29 DIAGNOSIS — J302 Other seasonal allergic rhinitis: Secondary | ICD-10-CM

## 2014-07-29 DIAGNOSIS — F410 Panic disorder [episodic paroxysmal anxiety] without agoraphobia: Secondary | ICD-10-CM

## 2014-07-29 DIAGNOSIS — E559 Vitamin D deficiency, unspecified: Secondary | ICD-10-CM

## 2014-07-29 NOTE — Patient Instructions (Addendum)
Continue current medications. Continue good therapeutic lifestyle changes which include good diet and exercise.  If an FOBT was given today- please return it to our front desk.  Flu Shots are still available at our office. If you still haven't had one please call to set up a nurse visit to get one.   After your visit with us today you will receive a survey in the mail or online from [employer] regarding your care with us. Please take a moment to fill this out. Your feedback is very important to us as you can help us better understand your patient needs as well as improve your experience and satisfaction. WE CARE ABOUT YOU!!!   Continue to take allergy medicines especially nose spray on a regular basis Continue vitamin D until lab work is back to make sure that you are on the appropriate dose Try to drink plenty of water and fluids--- it is important to keep well hydrated Take Allegra if additional allergy relief as needed, avoid the decongestants Avoid caffeine as much as possible as this can cause your heart to race more  Check outside blood pressure readings and bring those back to the next visit when we will review your lab work with you at that time. 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

## 2014-07-29 NOTE — Progress Notes (Signed)
Subjective:    Patient ID: Matthew Valdez, male    DOB: 02/16/1991, 24 y.o.   MRN: [id number]  HPI Patient is here today for annual wellness exam and follow up of chronic medical problems. He is taking medications regularly. The patient denies any chest pain except for occasional sharp pain that goes away quickly. He does describe some pounding heartbeats. He does not have any GI symptoms. She's passing his water without problems. He drinks caffeine but has not had much of this recently. He also describes that occasionally has some trouble swallowing but it is not related to food. He also has had some occasional tarry stools. 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 He starts his actual class work in the fall.      Patient Active Problem List   Diagnosis Date Noted  . Mixed hypercholesterolemia and hypertriglyceridemia 01/10/2014  . Other chest pain 04/02/2013  . 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 ANXIETY DISORDER 01/26/2010  . PANIC DISORDER 01/26/2010  . RESTRICTIVE LUNG DISEASE 01/26/2010  . GERD 01/26/2010   Outpatient Encounter Prescriptions as of 07/29/2014  Medication Sig  . fexofenadine (ALLEGRA) 180 MG tablet Take 180 mg by mouth daily.  . fluticasone (FLONASE) 50 MCG/ACT nasal spray One to 2 sprays each nostril daily  . mometasone (NASONEX) 50 MCG/ACT nasal spray Place 2 sprays into the nose daily.  . Vitamin D, Ergocalciferol, (DRISDOL) 50000 UNITS CAPS capsule TAKE ONE CAPSULE BY MOUTH WEEKLY  . [DISCONTINUED] amoxicillin (AMOXIL) 500 MG capsule Take 1 capsule (500 mg total) by mouth 3 (three) times daily.    Review of Systems  Constitutional: Positive for fatigue.  HENT: Negative.        Ringing of left ear    Eyes: Negative.   Respiratory: Negative.   Cardiovascular: Negative.        Heart beat feels "hard"  Gastrointestinal: Negative.     Endocrine: Negative.   Genitourinary: Negative.   Musculoskeletal: Negative.   Skin: Negative.   Allergic/Immunologic: Negative.   Neurological: Positive for dizziness.  Hematological: Negative.   Psychiatric/Behavioral: Negative.        Objective:   Physical Exam  Constitutional: He is oriented to person, place, and time. He appears well-developed and well-nourished. No distress.  HENT:  Head: Normocephalic and atraumatic.  Right Ear: External ear normal.  Left Ear: External ear normal.  Nose: Nose normal.  Mouth/Throat: Oropharynx is clear and moist. No oropharyngeal exudate.  Eyes: Conjunctivae and EOM are normal. Pupils are equal, round, and reactive to light. Right eye exhibits no discharge. Left eye exhibits no discharge. No scleral icterus.  Neck: Normal range of motion. Neck supple. No thyromegaly present.  No anterior cervical nodes  No masses no thyromegaly  Cardiovascular: Normal rate, regular rhythm, normal heart sounds and intact distal pulses.  Exam reveals no gallop and no friction rub.   No murmur heard. At 72/m  Pulmonary/Chest: Effort normal and breath sounds normal. No respiratory distress. He has no wheezes. He has no rales. He exhibits no tenderness.  Lungs are clear anteriorly and posteriorly and there is no axillary adenopathy. The chest wall is negative for masses  Abdominal: Soft. Bowel sounds are normal. He exhibits no mass. There is no tenderness. There is no rebound and no guarding.  The abdomen is nontender without splenomegaly hepatomegaly or inguinal adenopathy.  Genitourinary:  Penis normal.  There were no inguinal hernias palpated and the testicles were normal.  Musculoskeletal: Normal range of motion. He exhibits no edema or tenderness.  Lymphadenopathy:    He has no cervical adenopathy.  Neurological: He is alert and oriented to person, place, and time. He has normal reflexes. No cranial nerve deficit.  Skin: Skin is warm and dry. No rash  noted. No erythema. No pallor.  Psychiatric: He has a normal mood and affect. His behavior is normal. Judgment and thought content normal.  His mood was somewhat uptight and this is not unusual for him.  Nursing note and vitals reviewed.  BP 151/96 mmHg  Pulse 95  Temp(Src) 96.9 F (36.1 C) (Oral)  Ht _0  (1.803 m)  Wt 186 lb (84.369 kg)  BMI 25.95 kg/m2  Repeat blood pressure right arm sitting was 130/79 with a regular cuff  EKG: No change from previous EKG.       Assessment & Plan:  1. Annual physical exam -Overall, the patient is doing well and he does remain somewhat anxious and worried about his health condition. - POCT CBC; Future - BMP8+EGFR; Future - Hepatic function panel; Future - NMR, lipoprofile; Future - Vit D  25 hydroxy (rtn osteoporosis monitoring); Future - Thyroid Panel With TSH; Future - POCT UA - Microscopic Only; Future - POCT urinalysis dipstick; Future - Td vaccine preservative free greater than or equal to 7yo IM - EKG 12-Lead  2. Vitamin D deficiency -He should continue taking his current vitamin D dosing dependent upon lab work which is yet to be drawn. - POCT CBC; Future - Vit D  25 hydroxy (rtn osteoporosis monitoring); Future  3. Other fatigue -There is no obvious reason for his fatigue, we will check lab work and make sure that objectively there is no reason for this either. - POCT CBC; Future - BMP8+EGFR; Future - Hepatic function panel; Future - NMR, lipoprofile; Future - Vit D  25 hydroxy (rtn osteoporosis monitoring); Future - Thyroid Panel With TSH; Future - POCT UA - Microscopic Only; Future - POCT urinalysis dipstick; Future - EKG 12-Lead  4. Need for TD vaccine - Td vaccine preservative free greater than or equal to 7yo IM  5. PANIC DISORDER -He is doing much better regarding his panic disorder but she'll get sweaty and anxious at times., He is not taking any medication for this at present.  6. ARDS survivor -He  continues to be followed periodically by the pulmonologist and he has had a good past winter and he is only using Flonase regularly and occasionally Allegra.  7. Seasonal allergies -Continue with Flonase and as needed Allegra  Patient Instructions  Continue current medications. Continue good therapeutic lifestyle changes which include good diet and exercise.  If an FOBT was given today- please return it to our front desk.  Flu Shots are still available at our office. If you still haven't had one please call to set up a nurse visit to get one.   After your visit with [place] today you will receive a survey in the mail or online from [employer] regarding your care with [place]. Please take a moment to fill this out. Your feedback is very important to [place] as you can help [place] better understand your patient needs as well as improve your experience and satisfaction. WE CARE ABOUT YOU!!!   Continue to take allergy medicines especially nose spray on a regular basis Continue vitamin D until lab work is back to make  sure that you are on the appropriate dose Try to drink plenty of water and fluids--- it is important to keep well hydrated Take Allegra if additional allergy relief as needed, avoid the decongestants Avoid caffeine as much as possible as this can cause your heart to race more  Check outside blood pressure readings and bring those back to the next visit when we will review your lab work with you at that time. 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   Arrie Senate MD

## 2014-07-31 ENCOUNTER — Encounter: Payer: Self-pay | Admitting: Family Medicine

## 2014-08-01 ENCOUNTER — Other Ambulatory Visit: Payer: Self-pay | Admitting: Family Medicine

## 2014-08-03 NOTE — Telephone Encounter (Signed)
Last vit D level was 46.1 on 12/2013

## 2014-08-05 ENCOUNTER — Encounter: Payer: Self-pay | Admitting: Family Medicine

## 2014-08-11 ENCOUNTER — Ambulatory Visit: Payer: BLUE CROSS/BLUE SHIELD | Admitting: Family Medicine

## 2014-08-20 ENCOUNTER — Other Ambulatory Visit: Payer: BLUE CROSS/BLUE SHIELD

## 2014-09-25 ENCOUNTER — Ambulatory Visit: Payer: BLUE CROSS/BLUE SHIELD

## 2014-09-29 ENCOUNTER — Other Ambulatory Visit (INDEPENDENT_AMBULATORY_CARE_PROVIDER_SITE_OTHER): Payer: BLUE CROSS/BLUE SHIELD

## 2014-09-29 ENCOUNTER — Ambulatory Visit (INDEPENDENT_AMBULATORY_CARE_PROVIDER_SITE_OTHER): Payer: BLUE CROSS/BLUE SHIELD

## 2014-09-29 DIAGNOSIS — R5383 Other fatigue: Secondary | ICD-10-CM | POA: Diagnosis not present

## 2014-09-29 DIAGNOSIS — Z8709 Personal history of other diseases of the respiratory system: Secondary | ICD-10-CM | POA: Diagnosis not present

## 2014-09-29 DIAGNOSIS — Z0000 Encounter for general adult medical examination without abnormal findings: Secondary | ICD-10-CM

## 2014-09-29 DIAGNOSIS — Z111 Encounter for screening for respiratory tuberculosis: Secondary | ICD-10-CM | POA: Diagnosis not present

## 2014-09-29 DIAGNOSIS — K219 Gastro-esophageal reflux disease without esophagitis: Secondary | ICD-10-CM | POA: Diagnosis not present

## 2014-09-29 DIAGNOSIS — E559 Vitamin D deficiency, unspecified: Secondary | ICD-10-CM

## 2014-09-29 LAB — POCT CBC
Granulocyte percent: 67.6 %G (ref 37–80)
HCT, POC: 47.7 % (ref 43.5–53.7)
Hemoglobin: 15.4 g/dL (ref 14.1–18.1)
Lymph, poc: 2 (ref 0.6–3.4)
MCH: 29.5 pg (ref 27–31.2)
MCHC: 32.2 g/dL (ref 31.8–35.4)
MCV: 91.5 fL (ref 80–97)
MPV: 8.5 fL (ref 0–99.8)
POC Granulocyte: 4.3 (ref 2–6.9)
POC LYMPH PERCENT: 31.4 %L (ref 10–50)
Platelet Count, POC: 226 10*3/uL (ref 142–424)
RBC: 5.21 M/uL (ref 4.69–6.13)
RDW, POC: 12.5 %
WBC: 6.3 10*3/uL (ref 4.6–10.2)

## 2014-09-30 LAB — THYROID PANEL WITH TSH
FREE THYROXINE INDEX: 2.3 (ref 1.2–4.9)
T3 Uptake Ratio: 30 % (ref 24–39)
T4, Total: 7.5 ug/dL (ref 4.5–12.0)
TSH: 2.23 u[IU]/mL (ref 0.450–4.500)

## 2014-09-30 LAB — BMP8+EGFR
BUN/Creatinine Ratio: 15 (ref 8–19)
BUN: 15 mg/dL (ref 6–20)
CO2: 21 mmol/L (ref 18–29)
Calcium: 9.6 mg/dL (ref 8.7–10.2)
Chloride: 103 mmol/L (ref 97–108)
Creatinine, Ser: 1.01 mg/dL (ref 0.76–1.27)
GFR calc non Af Amer: 104 mL/min/{1.73_m2} (ref >59)
GFR, EST AFRICAN AMERICAN: 121 mL/min/{1.73_m2} (ref >59)
Glucose: 75 mg/dL (ref 65–99)
Potassium: 4.2 mmol/L (ref 3.5–5.2)
SODIUM: 143 mmol/L (ref 134–144)

## 2014-09-30 LAB — HEPATIC FUNCTION PANEL
ALT: 33 IU/L (ref 0–44)
AST: 27 IU/L (ref 0–40)
Albumin: 4.9 g/dL (ref 3.5–5.5)
Alkaline Phosphatase: 43 IU/L (ref 39–117)
Bilirubin Total: 0.9 mg/dL (ref 0.0–1.2)
Bilirubin, Direct: 0.19 mg/dL (ref 0.00–0.40)
TOTAL PROTEIN: 7.3 g/dL (ref 6.0–8.5)

## 2014-09-30 LAB — NMR, LIPOPROFILE
Cholesterol: 175 mg/dL (ref 100–199)
HDL Cholesterol by NMR: 41 mg/dL (ref >39)
HDL Particle Number: 30.5 umol/L (ref >=30.5)
LDL PARTICLE NUMBER: 1257 nmol/L — AB (ref <1000)
LDL SIZE: 20.8 nm (ref >20.5)
LDL-C: 109 mg/dL — ABNORMAL HIGH (ref 0–99)
LP-IR SCORE: 64 — AB (ref <=45)
SMALL LDL PARTICLE NUMBER: 497 nmol/L (ref <=527)
TRIGLYCERIDES BY NMR: 127 mg/dL (ref 0–149)

## 2014-09-30 LAB — VITAMIN D 25 HYDROXY (VIT D DEFICIENCY, FRACTURES): Vit D, 25-Hydroxy: 50.9 ng/mL (ref 30.0–100.0)

## 2014-09-30 LAB — VARICELLA ZOSTER ANTIBODY, IGG: VARICELLA: 2155 {index} (ref >165)

## 2014-10-01 LAB — TB SKIN TEST
INDURATION: 0 mm
TB Skin Test: NEGATIVE

## 2014-10-02 ENCOUNTER — Ambulatory Visit: Payer: BLUE CROSS/BLUE SHIELD

## 2014-10-14 ENCOUNTER — Other Ambulatory Visit (INDEPENDENT_AMBULATORY_CARE_PROVIDER_SITE_OTHER): Payer: BLUE CROSS/BLUE SHIELD | Admitting: *Deleted

## 2014-10-14 DIAGNOSIS — Z23 Encounter for immunization: Secondary | ICD-10-CM

## 2014-11-16 ENCOUNTER — Other Ambulatory Visit: Payer: Self-pay | Admitting: Family Medicine

## 2014-11-17 NOTE — Telephone Encounter (Signed)
Last vit. D level was 50.9 on 10/09/14.

## 2015-01-10 ENCOUNTER — Other Ambulatory Visit: Payer: Self-pay | Admitting: Family Medicine

## 2015-01-11 NOTE — Telephone Encounter (Signed)
Last seen 07/29/14  DWM  Last Vit D 09/29/14  50.9

## 2015-01-11 NOTE — Telephone Encounter (Signed)
This is okay to refill for 3 months in sometime before this prescription expires the patient should come by and get another vitamin D level.

## 2015-01-29 ENCOUNTER — Ambulatory Visit (INDEPENDENT_AMBULATORY_CARE_PROVIDER_SITE_OTHER): Payer: BLUE CROSS/BLUE SHIELD

## 2015-01-29 DIAGNOSIS — J329 Chronic sinusitis, unspecified: Secondary | ICD-10-CM

## 2015-01-29 DIAGNOSIS — Z23 Encounter for immunization: Secondary | ICD-10-CM

## 2015-01-29 MED ORDER — FLUTICASONE PROPIONATE 50 MCG/ACT NA SUSP: NASAL | Status: DC

## 2015-01-29 MED ORDER — VITAMIN D (ERGOCALCIFEROL) 1.25 MG (50000 UNIT) PO CAPS: 50000.0000 [IU] | ORAL_CAPSULE | ORAL | Status: DC

## 2015-02-12 ENCOUNTER — Encounter: Payer: Self-pay | Admitting: Family Medicine

## 2015-02-12 ENCOUNTER — Ambulatory Visit (INDEPENDENT_AMBULATORY_CARE_PROVIDER_SITE_OTHER): Payer: BLUE CROSS/BLUE SHIELD | Admitting: Family Medicine

## 2015-02-12 VITALS — BP 138/81 | HR 71 | Temp 97.7°F | Ht 71.0 in | Wt 182.0 lb

## 2015-02-12 DIAGNOSIS — E782 Mixed hyperlipidemia: Secondary | ICD-10-CM

## 2015-02-12 DIAGNOSIS — J984 Other disorders of lung: Secondary | ICD-10-CM

## 2015-02-12 DIAGNOSIS — Z8709 Personal history of other diseases of the respiratory system: Secondary | ICD-10-CM | POA: Diagnosis not present

## 2015-02-12 DIAGNOSIS — J302 Other seasonal allergic rhinitis: Secondary | ICD-10-CM

## 2015-02-12 DIAGNOSIS — F410 Panic disorder [episodic paroxysmal anxiety] without agoraphobia: Secondary | ICD-10-CM | POA: Diagnosis not present

## 2015-02-12 DIAGNOSIS — R5383 Other fatigue: Secondary | ICD-10-CM

## 2015-02-12 DIAGNOSIS — E559 Vitamin D deficiency, unspecified: Secondary | ICD-10-CM

## 2015-02-12 DIAGNOSIS — Z0000 Encounter for general adult medical examination without abnormal findings: Secondary | ICD-10-CM

## 2015-02-12 NOTE — Patient Instructions (Addendum)
Continue current medications. Continue good therapeutic lifestyle changes which include good diet and exercise. Fall precautions discussed with patient. If an FOBT was given - please return it to our front desk.  **Flu shots are available--- please call and schedule a FLU-CLINIC appointment**  After your visit with us today you will receive a survey in the mail or online from [employer] regarding your care with us. Please take a moment to fill this out. Your feedback is very important to us as you can help us better understand your patient needs as well as improve your experience and satisfaction. WE CARE ABOUT YOU!!!   The patient was encouraged to eat less fast food and to drink less caffeine. He will return to clinic on a Saturday morning to get his lab work done He is on the list to get an exercise treadmill test He is encouraged to get an eye exam This winter he should drink more fluids stay well hydrated and use Mucinex if needed for cough and congestion I would also encourage him to use his Flonase at nighttime because he has nasal congestion 1 spray each nostril at bedtime He should also watch his sodium intake.

## 2015-02-12 NOTE — Progress Notes (Signed)
Subjective:    Patient ID: Matthew Valdez, male    DOB: 27-Apr-1990, 24 y.o.   MRN: [id number]  HPI Pt here for follow up and management of chronic medical problems which includes hyperlipidemia. He is taking medications regularly. Patient is doing well today. He has had his flu vaccine. He is due to return an FOBT. He wants to wait on getting his lab work and this is okay. 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 He is working hard and working to achieve this goal. He is in good spirits today. He does complain of some fatigue. It is most likely secondary to his increased hours of study and an working. He denies any chest pain or significant shortness of breath. He has not been eating well he is eating a lot of fast food. He is also drinking a lot of caffeine. He is not having trouble with his stomach heartburn indigestion and nausea vomiting diarrhea or blood in the stool and is passing his water without problems. He will return to clinic on Saturday to get his lab work done.      Patient Active Problem List   Diagnosis Date Noted  . Mixed hypercholesterolemia and hypertriglyceridemia 01/10/2014  . Other chest pain 04/02/2013  . 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 ANXIETY DISORDER 01/26/2010  . PANIC DISORDER 01/26/2010  . RESTRICTIVE LUNG DISEASE 01/26/2010  . GERD 01/26/2010   Outpatient Encounter Prescriptions as of 02/12/2015  Medication Sig  . Cyanocobalamin (B-12) 100 MCG TABS Take 1 tablet by mouth daily.  . fexofenadine (ALLEGRA) 180 MG tablet Take 180 mg by mouth daily.  . fluticasone (FLONASE) 50 MCG/ACT nasal spray One to 2 sprays each nostril daily  . mometasone (NASONEX) 50 MCG/ACT nasal spray Place 2 sprays into the nose daily.  . Vitamin D, Ergocalciferol, (DRISDOL) 50000 UNITS CAPS capsule Take 1 capsule (50,000 Units total) by mouth once a week.   No facility-administered encounter medications on file  as of 02/12/2015.      Review of Systems  Constitutional: Positive for fatigue.  HENT: Negative.   Eyes: Negative.   Respiratory: Negative.   Cardiovascular: Negative.   Gastrointestinal: Negative.   Endocrine: Negative.   Genitourinary: Negative.   Musculoskeletal: Negative.   Skin: Negative.   Allergic/Immunologic: Negative.   Neurological: Negative.   Hematological: Negative.   Psychiatric/Behavioral: Negative.        Objective:   Physical Exam  Constitutional: He is oriented to person, place, and time. He appears well-developed and well-nourished. No distress.  HENT:  Head: Normocephalic and atraumatic.  Right Ear: External ear normal.  Left Ear: External ear normal.  Mouth/Throat: Oropharynx is clear and moist. No oropharyngeal exudate.  Nasal congestion left greater than right  Eyes: Conjunctivae and EOM are normal. Pupils are equal, round, and reactive to light. Right eye exhibits no discharge. Left eye exhibits no discharge. No scleral icterus.  Neck: Normal range of motion. Neck supple. No thyromegaly present.  Cardiovascular: Normal rate, regular rhythm, normal heart sounds and intact distal pulses.   No murmur heard. At 72/m  Pulmonary/Chest: Effort normal and breath sounds normal. No respiratory distress. He has no wheezes. He has no rales. He exhibits no tenderness.  Clear anteriorly and posteriorly with good breath sounds bilaterally and no wheezes or rales  Abdominal: Soft. Bowel sounds are normal. He exhibits no mass. There is no tenderness. There is no rebound and no guarding.  Nontender without masses  Musculoskeletal: Normal range of motion. He exhibits no edema.  No axillary adenopathy  Lymphadenopathy:    He has no cervical adenopathy.  Neurological: He is alert and oriented to person, place, and time. He has normal reflexes. No cranial nerve deficit.  Skin: Skin is warm and dry. No rash noted.  Psychiatric: He has a normal mood and affect. His  behavior is normal. Judgment and thought content normal.  Nursing note and vitals reviewed.  BP 138/81 mmHg  Pulse 71  Temp(Src) 97.7 F (36.5 C) (Oral)  Ht _0  (1.803 m)  Wt 182 lb (82.555 kg)  BMI 25.40 kg/m2        Assessment & Plan:  1. Vitamin D deficiency -Continue current treatment pending results of lab work - CBC with Differential/Platelet; Future - Vit D  25 hydroxy (rtn osteoporosis monitoring); Future  2. Routine health maintenance -The patient has had his flu shot already. -He should return the FOBT - BMP8+EGFR; Future - CBC with Differential/Platelet; Future - Hepatic function panel; Future - NMR, lipoprofile; Future - Thyroid Panel With TSH; Future - Vit D  25 hydroxy (rtn osteoporosis monitoring); Future  3. Seasonal allergies -He should use his Flonase regularly and take Mucinex if needed for cough and congestion - CBC with Differential/Platelet; Future  4. ARDS survivor -He should practice good respiratory hygiene and avoid irritating environments - BMP8+EGFR; Future - CBC with Differential/Platelet; Future - Hepatic function panel; Future  5. Restrictive lung disease -Drink lots of fluids take Mucinex as needed and avoid irritating environments - BMP8+EGFR; Future - CBC with Differential/Platelet; Future - Hepatic function panel; Future  6. PANIC DISORDER -Reduce caffeine as much as possible - CBC with Differential/Platelet; Future  7. Mixed hypercholesterolemia and hypertriglyceridemia -Eat less fast food - CBC with Differential/Platelet; Future - NMR, lipoprofile; Future  8. Other fatigue - BMP8+EGFR; Future - CBC with Differential/Platelet; Future - Hepatic function panel; Future - NMR, lipoprofile; Future - Thyroid Panel With TSH; Future - Vit D  25 hydroxy (rtn osteoporosis monitoring); Future  Patient Instructions  Continue current medications. Continue good therapeutic lifestyle changes which include good diet and  exercise. Fall precautions discussed with patient. If an FOBT was given - please return it to our front desk.  **Flu shots are available--- please call and schedule a FLU-CLINIC appointment**  After your visit with [place] today you will receive a survey in the mail or online from [employer] regarding your care with [place]. Please take a moment to fill this out. Your feedback is very important to [place] as you can help [place] better understand your patient needs as well as improve your experience and satisfaction. WE CARE ABOUT YOU!!!   The patient was encouraged to eat less fast food and to drink less caffeine. He will return to clinic on a Saturday morning to get his lab work done He is on the list to get an exercise treadmill test He is encouraged to get an eye exam This winter he should drink more fluids stay well hydrated and use Mucinex if needed for cough and congestion I would also encourage him to use his Flonase at nighttime because he has nasal congestion 1 spray each nostril at bedtime He should also watch his sodium intake.     Arrie Senate MD

## 2015-04-13 ENCOUNTER — Other Ambulatory Visit: Payer: Self-pay | Admitting: *Deleted

## 2015-04-13 ENCOUNTER — Telehealth: Payer: Self-pay | Admitting: *Deleted

## 2015-04-13 ENCOUNTER — Other Ambulatory Visit: Payer: BLUE CROSS/BLUE SHIELD

## 2015-04-13 DIAGNOSIS — Z0000 Encounter for general adult medical examination without abnormal findings: Secondary | ICD-10-CM

## 2015-04-13 DIAGNOSIS — E782 Mixed hyperlipidemia: Secondary | ICD-10-CM

## 2015-04-13 DIAGNOSIS — J984 Other disorders of lung: Secondary | ICD-10-CM

## 2015-04-13 DIAGNOSIS — Z8709 Personal history of other diseases of the respiratory system: Secondary | ICD-10-CM

## 2015-04-13 DIAGNOSIS — J302 Other seasonal allergic rhinitis: Secondary | ICD-10-CM

## 2015-04-13 DIAGNOSIS — E559 Vitamin D deficiency, unspecified: Secondary | ICD-10-CM

## 2015-04-13 NOTE — Telephone Encounter (Signed)
Pt aware of next appt  

## 2015-04-13 NOTE — Addendum Note (Signed)
Addended by: Tommas OlpHANDY, Enaya Howze N on: 04/13/2015 01:40 PM   Modules accepted: Orders

## 2015-04-14 LAB — LIPID PANEL
CHOL/HDL RATIO: 4.1 ratio (ref 0.0–5.0)
Cholesterol, Total: 198 mg/dL (ref 100–199)
HDL: 48 mg/dL (ref >39)
LDL Calculated: 121 mg/dL — ABNORMAL HIGH (ref 0–99)
TRIGLYCERIDES: 143 mg/dL (ref 0–149)
VLDL Cholesterol Cal: 29 mg/dL (ref 5–40)

## 2015-04-14 LAB — HEPATIC FUNCTION PANEL
ALBUMIN: 4.7 g/dL (ref 3.5–5.5)
ALT: 24 IU/L (ref 0–44)
AST: 25 IU/L (ref 0–40)
Alkaline Phosphatase: 43 IU/L (ref 39–117)
BILIRUBIN TOTAL: 0.7 mg/dL (ref 0.0–1.2)
BILIRUBIN, DIRECT: 0.2 mg/dL (ref 0.00–0.40)
Total Protein: 7.2 g/dL (ref 6.0–8.5)

## 2015-04-14 LAB — BMP8+EGFR
BUN/Creatinine Ratio: 12 (ref 8–19)
BUN: 12 mg/dL (ref 6–20)
CHLORIDE: 100 mmol/L (ref 96–106)
CO2: 26 mmol/L (ref 18–29)
Calcium: 9.8 mg/dL (ref 8.7–10.2)
Creatinine, Ser: 0.99 mg/dL (ref 0.76–1.27)
GFR calc Af Amer: 124 mL/min/{1.73_m2} (ref >59)
GFR calc non Af Amer: 107 mL/min/{1.73_m2} (ref >59)
GLUCOSE: 88 mg/dL (ref 65–99)
POTASSIUM: 4 mmol/L (ref 3.5–5.2)
SODIUM: 140 mmol/L (ref 134–144)

## 2015-04-14 LAB — THYROID PANEL WITH TSH
FREE THYROXINE INDEX: 2.4 (ref 1.2–4.9)
T3 UPTAKE RATIO: 29 % (ref 24–39)
T4 TOTAL: 8.4 ug/dL (ref 4.5–12.0)
TSH: 1.93 u[IU]/mL (ref 0.450–4.500)

## 2015-04-14 LAB — CBC WITH DIFFERENTIAL/PLATELET
BASOS ABS: 0 10*3/uL (ref 0.0–0.2)
BASOS: 0 %
EOS (ABSOLUTE): 0.1 10*3/uL (ref 0.0–0.4)
Eos: 2 %
Hematocrit: 44.5 % (ref 37.5–51.0)
Hemoglobin: 15.4 g/dL (ref 12.6–17.7)
IMMATURE GRANULOCYTES: 1 %
Immature Grans (Abs): 0.1 10*3/uL (ref 0.0–0.1)
Lymphocytes Absolute: 2.7 10*3/uL (ref 0.7–3.1)
Lymphs: 40 %
MCH: 30.8 pg (ref 26.6–33.0)
MCHC: 34.6 g/dL (ref 31.5–35.7)
MCV: 89 fL (ref 79–97)
MONOS ABS: 0.7 10*3/uL (ref 0.1–0.9)
Monocytes: 10 %
NEUTROS PCT: 47 %
Neutrophils Absolute: 3.3 10*3/uL (ref 1.4–7.0)
PLATELETS: 200 10*3/uL (ref 150–379)
RBC: 5 x10E6/uL (ref 4.14–5.80)
RDW: 13.6 % (ref 12.3–15.4)
WBC: 6.8 10*3/uL (ref 3.4–10.8)

## 2015-04-14 LAB — VITAMIN D 25 HYDROXY (VIT D DEFICIENCY, FRACTURES): Vit D, 25-Hydroxy: 39.9 ng/mL (ref 30.0–100.0)

## 2015-04-30 ENCOUNTER — Other Ambulatory Visit: Payer: Self-pay | Admitting: Family Medicine

## 2015-07-16 ENCOUNTER — Ambulatory Visit: Payer: BLUE CROSS/BLUE SHIELD | Admitting: Family Medicine

## 2015-07-22 ENCOUNTER — Ambulatory Visit: Payer: BLUE CROSS/BLUE SHIELD | Admitting: Family Medicine

## 2015-07-30 ENCOUNTER — Encounter: Payer: Self-pay | Admitting: Family Medicine

## 2015-07-30 ENCOUNTER — Encounter: Payer: Self-pay | Admitting: *Deleted

## 2015-07-30 ENCOUNTER — Encounter (INDEPENDENT_AMBULATORY_CARE_PROVIDER_SITE_OTHER): Payer: Self-pay

## 2015-07-30 ENCOUNTER — Ambulatory Visit (INDEPENDENT_AMBULATORY_CARE_PROVIDER_SITE_OTHER): Payer: BLUE CROSS/BLUE SHIELD | Admitting: Family Medicine

## 2015-07-30 VITALS — BP 137/88 | HR 81 | Temp 97.9°F | Ht 71.0 in | Wt 183.0 lb

## 2015-07-30 DIAGNOSIS — E782 Mixed hyperlipidemia: Secondary | ICD-10-CM

## 2015-07-30 DIAGNOSIS — E559 Vitamin D deficiency, unspecified: Secondary | ICD-10-CM

## 2015-07-30 DIAGNOSIS — F411 Generalized anxiety disorder: Secondary | ICD-10-CM

## 2015-07-30 DIAGNOSIS — J09X2 Influenza due to identified novel influenza A virus with other respiratory manifestations: Secondary | ICD-10-CM

## 2015-07-30 DIAGNOSIS — Z8709 Personal history of other diseases of the respiratory system: Secondary | ICD-10-CM

## 2015-07-30 DIAGNOSIS — J984 Other disorders of lung: Secondary | ICD-10-CM

## 2015-07-30 DIAGNOSIS — Z0000 Encounter for general adult medical examination without abnormal findings: Secondary | ICD-10-CM

## 2015-07-30 DIAGNOSIS — R6889 Other general symptoms and signs: Secondary | ICD-10-CM

## 2015-07-30 LAB — VERITOR FLU A/B WAIVED
INFLUENZA B: NEGATIVE
Influenza A: POSITIVE — AB

## 2015-07-30 LAB — URINALYSIS, COMPLETE
BILIRUBIN UA: NEGATIVE
GLUCOSE, UA: NEGATIVE
Ketones, UA: NEGATIVE
LEUKOCYTES UA: NEGATIVE
Nitrite, UA: NEGATIVE
RBC, UA: NEGATIVE
SPEC GRAV UA: 1.02 (ref 1.005–1.030)
UUROB: 0.2 mg/dL (ref 0.2–1.0)
pH, UA: 5.5 (ref 5.0–7.5)

## 2015-07-30 LAB — MICROSCOPIC EXAMINATION
BACTERIA UA: NONE SEEN
Epithelial Cells (non renal): NONE SEEN /hpf (ref 0–10)
RBC, UA: NONE SEEN /hpf (ref 0–?)
WBC, UA: NONE SEEN /hpf (ref 0–?)

## 2015-07-30 MED ORDER — ALBUTEROL SULFATE HFA 108 (90 BASE) MCG/ACT IN AERS: 1.0000 | INHALATION_SPRAY | Freq: Four times a day (QID) | RESPIRATORY_TRACT | Status: DC | PRN

## 2015-07-30 MED ORDER — BECLOMETHASONE DIPROPIONATE 40 MCG/ACT IN AERS
1.0000 | INHALATION_SPRAY | Freq: Two times a day (BID) | RESPIRATORY_TRACT | Status: DC
Start: 2015-07-30 — End: 2016-09-01

## 2015-07-30 MED ORDER — OSELTAMIVIR PHOSPHATE 75 MG PO CAPS: 75.0000 mg | ORAL_CAPSULE | Freq: Two times a day (BID) | ORAL | Status: DC

## 2015-07-30 MED ORDER — AZITHROMYCIN 250 MG PO TABS: ORAL_TABLET | ORAL | Status: DC

## 2015-07-30 NOTE — Patient Instructions (Addendum)
Continue current medications. Continue good therapeutic lifestyle changes which include good diet and exercise. Fall precautions discussed with patient. If an FOBT was given today- please return it to our front desk.  **Flu shots are available--- please call and schedule a FLU-CLINIC appointment**  After your visit with us today you will receive a survey in the mail or online from [employer] regarding your care with us. Please take a moment to fill this out. Your feedback is very important to us as you can help us better understand your patient needs as well as improve your experience and satisfaction. WE CARE ABOUT YOU!!!   Rest and get plenty of fluids Take antibiotic as directed Take Tamiflu as directed and until completed Mucinex maximum strength 1 twice a day with a large glass of water for cough and congestion Use nasal saline 3 or 4 times daily each nostril Continue to use Rhinocort 1 spray each nostril daily and this should always be started earlier in the season for example in mid February for the spring pollen season. Take Tylenol for aches pains and fever

## 2015-07-30 NOTE — Progress Notes (Signed)
Subjective:    Patient ID: Matthew Valdez, male    DOB: 12/31/1990, 25 y.o.   MRN: [id number]  HPI Pt here for follow up and management of chronic medical problems which includes hyperlipidemia. He is taking medications regularly.The patient has had some congestion and drainage and some myalgias. This is been going on for couple days. He is using Rhinocort. His vision today was good. He has had recent lab work and this will be reviewed with him. The lab work ask he goes back to December 20. The CBC was within normal limits. His blood sugar kidney function test and electrolytes were good. All his liver function tests were normal. His vitamin D was 39.9 and previously it was higher at 50.9. Cholesterol numbers with traditional lipid testing in December 2016 and an LDL C cholesterol that was elevated at 121. Triglycerides were good. The patient denies any fever or yellow drainage. Most of the congestion is evidence head. He has had a flu shot.     Patient Active Problem List   Diagnosis Date Noted  . Mixed hypercholesterolemia and hypertriglyceridemia 01/10/2014  . Other chest pain 04/02/2013  . 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 ANXIETY DISORDER 01/26/2010  . PANIC DISORDER 01/26/2010  . Restrictive lung disease 01/26/2010  . GERD 01/26/2010   Outpatient Encounter Prescriptions as of 07/30/2015  Medication Sig  . Budesonide (RHINOCORT ALLERGY NA) Place into the nose.  . Cyanocobalamin (B-12) 100 MCG TABS Take 1 tablet by mouth daily.  . fexofenadine (ALLEGRA) 180 MG tablet Take 180 mg by mouth daily.  . Vitamin D, Ergocalciferol, (DRISDOL) 50000 units CAPS capsule TAKE 1 CAPSULE (50,000 UNITS  TOTAL) BY MOUTH ONCE A WEEK.  . [DISCONTINUED] fluticasone (FLONASE) 50 MCG/ACT nasal spray One to 2 sprays each nostril daily (Patient not taking: Reported on 07/30/2015)  . [DISCONTINUED] mometasone (NASONEX) 50 MCG/ACT nasal spray Place 2 sprays into the nose daily. (Patient not taking: Reported on  07/30/2015)   No facility-administered encounter medications on file as of 07/30/2015.     Review of Systems  Constitutional: Negative.   HENT: Positive for congestion and postnasal drip.   Eyes: Negative.   Respiratory: Negative.   Cardiovascular: Negative.   Gastrointestinal: Negative.   Endocrine: Negative.   Genitourinary: Negative.   Musculoskeletal: Positive for myalgias.  Skin: Negative.   Allergic/Immunologic: Negative.   Neurological: Negative.   Hematological: Negative.   Psychiatric/Behavioral: Negative.        Objective:   Physical Exam  Constitutional: He is oriented to person, place, and time. He appears well-developed and well-nourished. No distress.  HENT:  Head: Normocephalic and atraumatic.  Right Ear: External ear normal.  Left Ear: External ear normal.  Mouth/Throat: Oropharynx is clear and moist. No oropharyngeal exudate.  Nasal congestion and redness bilaterally  Eyes: Conjunctivae and EOM are normal. Pupils are equal, round, and reactive to light. Right eye exhibits no discharge. Left eye exhibits no discharge. No scleral icterus.  Neck: Normal range of motion. Neck supple. No thyromegaly present.  No anterior cervical adenopathy  Cardiovascular: Normal rate and regular rhythm.   No murmur heard. Pulmonary/Chest: Effort normal and breath sounds normal. No respiratory distress. He has no wheezes. He has no rales. He exhibits no tenderness.  Clear anteriorly and posteriorly  Abdominal: Soft. Bowel sounds are normal. He exhibits no mass. There is no tenderness. There is no rebound and no guarding.  No abdominal tenderness or organ enlargement or bruits  Musculoskeletal: Normal range of motion. He  exhibits no edema or tenderness.  Lymphadenopathy:    He has no cervical adenopathy.  Neurological: He is alert and oriented to person, place, and time.  Skin: Skin is warm and dry. No rash noted.  Psychiatric: He has a normal mood and affect. His behavior is  normal. Judgment and thought content normal.  Nursing note and vitals reviewed.  BP 137/88 mmHg  Pulse 81  Temp(Src) 97.9 F (36.6 C) (Oral)  Ht  (1.803 m)  Wt 183 lb (83.008 kg)  BMI 25.53 kg/m2  SpO2 99%        Assessment & Plan:  1. Vitamin D deficiency -Continue vitamin D replacement  2. Annual physical exam -Repeat CBC because of positive flu test - Urinalysis, Complete  3. ARDS survivor -Always practice respiratory protection. Wear mask. Stay well hydrated. Use Mucinex if needed for cough and congestion. Use nasal saline especially in the winter months.  4. Mixed hypercholesterolemia and hypertriglyceridemia -Try to do better with diet and exercise  5. Flu-like symptoms -Rest and fluids. Take Tamiflu as directed because of positive flu test twice daily for 5 days and take Z-Pak until completed -Treat symptoms with Mucinex nasal saline and continue with Rhinocort - Veritor Flu A/B Waived - CBC with Differential/Platelet  6. Restrictive lung disease -Refill albuterol inhaler  7. Anxiety state -Reduce caffeine intake as much as possible  8. Influenza due to identified novel influenza A virus with other respiratory manifestations -Take Tamiflu as directed, drink plenty of fluids, take Tylenol as needed for aches pains and fever, take Mucinex for cough and congestion, and use Rhinocort.   Current outpatient prescriptions:  .  Budesonide (RHINOCORT ALLERGY NA), Place into the nose., Disp: , Rfl:  .  Cyanocobalamin (B-12) 100 MCG TABS, Take 1 tablet by mouth daily., Disp: , Rfl:  .  fexofenadine (ALLEGRA) 180 MG tablet, Take 180 mg by mouth daily., Disp: , Rfl:  .  Vitamin D, Ergocalciferol, (DRISDOL) 50000 units CAPS capsule, TAKE 1 CAPSULE (50,000 UNITS  TOTAL) BY MOUTH ONCE A WEEK., Disp: 12 capsule, Rfl: 0 .  albuterol (VENTOLIN HFA) 108 (90 Base) MCG/ACT inhaler, Inhale 1-2 puffs into the lungs every 6 (six) hours as needed for wheezing or shortness of  breath., Disp: 18 g, Rfl: 11 .  azithromycin (ZITHROMAX) 250 MG tablet, As directed, Disp: 6 tablet, Rfl: 0 .  beclomethasone (QVAR) 40 MCG/ACT inhaler, Inhale 1 puff into the lungs 2 (two) times daily., Disp: 1 Inhaler, Rfl: 11 .  oseltamivir (TAMIFLU) 75 MG capsule, Take 1 capsule (75 mg total) by mouth 2 (two) times daily., Disp: 10 capsule, Rfl: 0  Patient Instructions  Continue current medications. Continue good therapeutic lifestyle changes which include good diet and exercise. Fall precautions discussed with patient. If an FOBT was given today- please return it to our front desk.  **Flu shots are available--- please call and schedule a FLU-CLINIC appointment**  After your visit with [place] today you will receive a survey in the mail or online from [employer] regarding your care with [place]. Please take a moment to fill this out. Your feedback is very important to [place] as you can help [place] better understand your patient needs as well as improve your experience and satisfaction. WE CARE ABOUT YOU!!!   Rest and get plenty of fluids Take antibiotic as directed Take Tamiflu as directed and until completed Mucinex maximum strength 1 twice a day with a large glass of water for cough and congestion Use nasal saline  3 or 4 times daily each nostril Continue to use Rhinocort 1 spray each nostril daily and this should always be started earlier in the season for example in mid February for the spring pollen season. Take Tylenol for aches pains and fever   Nyra Capes MD

## 2015-07-31 LAB — CBC WITH DIFFERENTIAL/PLATELET
Basophils Absolute: 0 10*3/uL (ref 0.0–0.2)
Basos: 0 %
EOS (ABSOLUTE): 0 10*3/uL (ref 0.0–0.4)
EOS: 1 %
HEMOGLOBIN: 14.9 g/dL (ref 12.6–17.7)
Hematocrit: 43.1 % (ref 37.5–51.0)
IMMATURE GRANULOCYTES: 1 %
Immature Grans (Abs): 0 10*3/uL (ref 0.0–0.1)
LYMPHS: 23 %
Lymphocytes Absolute: 1.5 10*3/uL (ref 0.7–3.1)
MCH: 31.3 pg (ref 26.6–33.0)
MCHC: 34.6 g/dL (ref 31.5–35.7)
MCV: 91 fL (ref 79–97)
MONOCYTES: 21 %
Monocytes Absolute: 1.3 10*3/uL — ABNORMAL HIGH (ref 0.1–0.9)
NEUTROS PCT: 54 %
Neutrophils Absolute: 3.5 10*3/uL (ref 1.4–7.0)
PLATELETS: 188 10*3/uL (ref 150–379)
RBC: 4.76 x10E6/uL (ref 4.14–5.80)
RDW: 12.6 % (ref 12.3–15.4)
WBC: 6.4 10*3/uL (ref 3.4–10.8)

## 2015-08-24 ENCOUNTER — Other Ambulatory Visit: Payer: Self-pay | Admitting: Family Medicine

## 2015-10-17 ENCOUNTER — Other Ambulatory Visit: Payer: Self-pay | Admitting: Family Medicine

## 2015-10-27 ENCOUNTER — Ambulatory Visit (INDEPENDENT_AMBULATORY_CARE_PROVIDER_SITE_OTHER): Payer: BLUE CROSS/BLUE SHIELD

## 2015-10-27 DIAGNOSIS — Z111 Encounter for screening for respiratory tuberculosis: Secondary | ICD-10-CM

## 2015-10-27 NOTE — Progress Notes (Signed)
TB skin test administered to right forearm, patient tolerated well.

## 2015-10-29 LAB — TB SKIN TEST
Induration: 0 mm
TB SKIN TEST: NEGATIVE

## 2015-12-07 ENCOUNTER — Encounter: Payer: BLUE CROSS/BLUE SHIELD | Admitting: Family Medicine

## 2015-12-07 NOTE — Progress Notes (Deleted)
   Subjective:    Patient ID: Matthew Valdez, male    DOB: 07/11/1990, 25 y.o.   MRN: [id number]  HPI Patient here today for palpitations.        Review of Systems  Constitutional: Negative.   HENT: Negative.   Eyes: Negative.   Respiratory: Negative.   Cardiovascular: Positive for palpitations.  Gastrointestinal: Negative.   Endocrine: Negative.   Genitourinary: Negative.   Musculoskeletal: Negative.   Skin: Negative.   Allergic/Immunologic: Negative.   Neurological: Negative.   Hematological: Negative.   Psychiatric/Behavioral: Negative.        Objective:   Physical Exam        Assessment & Plan:

## 2015-12-08 ENCOUNTER — Encounter: Payer: Self-pay | Admitting: Family Medicine

## 2015-12-30 ENCOUNTER — Other Ambulatory Visit: Payer: Self-pay | Admitting: Family Medicine

## 2016-02-08 ENCOUNTER — Other Ambulatory Visit: Payer: Self-pay | Admitting: Nurse Practitioner

## 2016-02-11 ENCOUNTER — Ambulatory Visit: Payer: BLUE CROSS/BLUE SHIELD | Admitting: Family Medicine

## 2016-02-14 ENCOUNTER — Encounter: Payer: Self-pay | Admitting: Family Medicine

## 2016-03-22 ENCOUNTER — Other Ambulatory Visit: Payer: Self-pay | Admitting: Family Medicine

## 2016-04-12 ENCOUNTER — Other Ambulatory Visit: Payer: Self-pay | Admitting: Family Medicine

## 2016-05-16 ENCOUNTER — Other Ambulatory Visit: Payer: Self-pay | Admitting: Family Medicine

## 2016-05-26 IMAGING — CR DG CHEST 2V
2 series · 2 of 2 positions shown · non-contrast
Comparison: Chest radiograph April 02, 2013

CLINICAL DATA: Productive cough for 1 month.  Nonsmoker.

EXAM:
CHEST  2 VIEW

[view not recorded (1 of 2)]
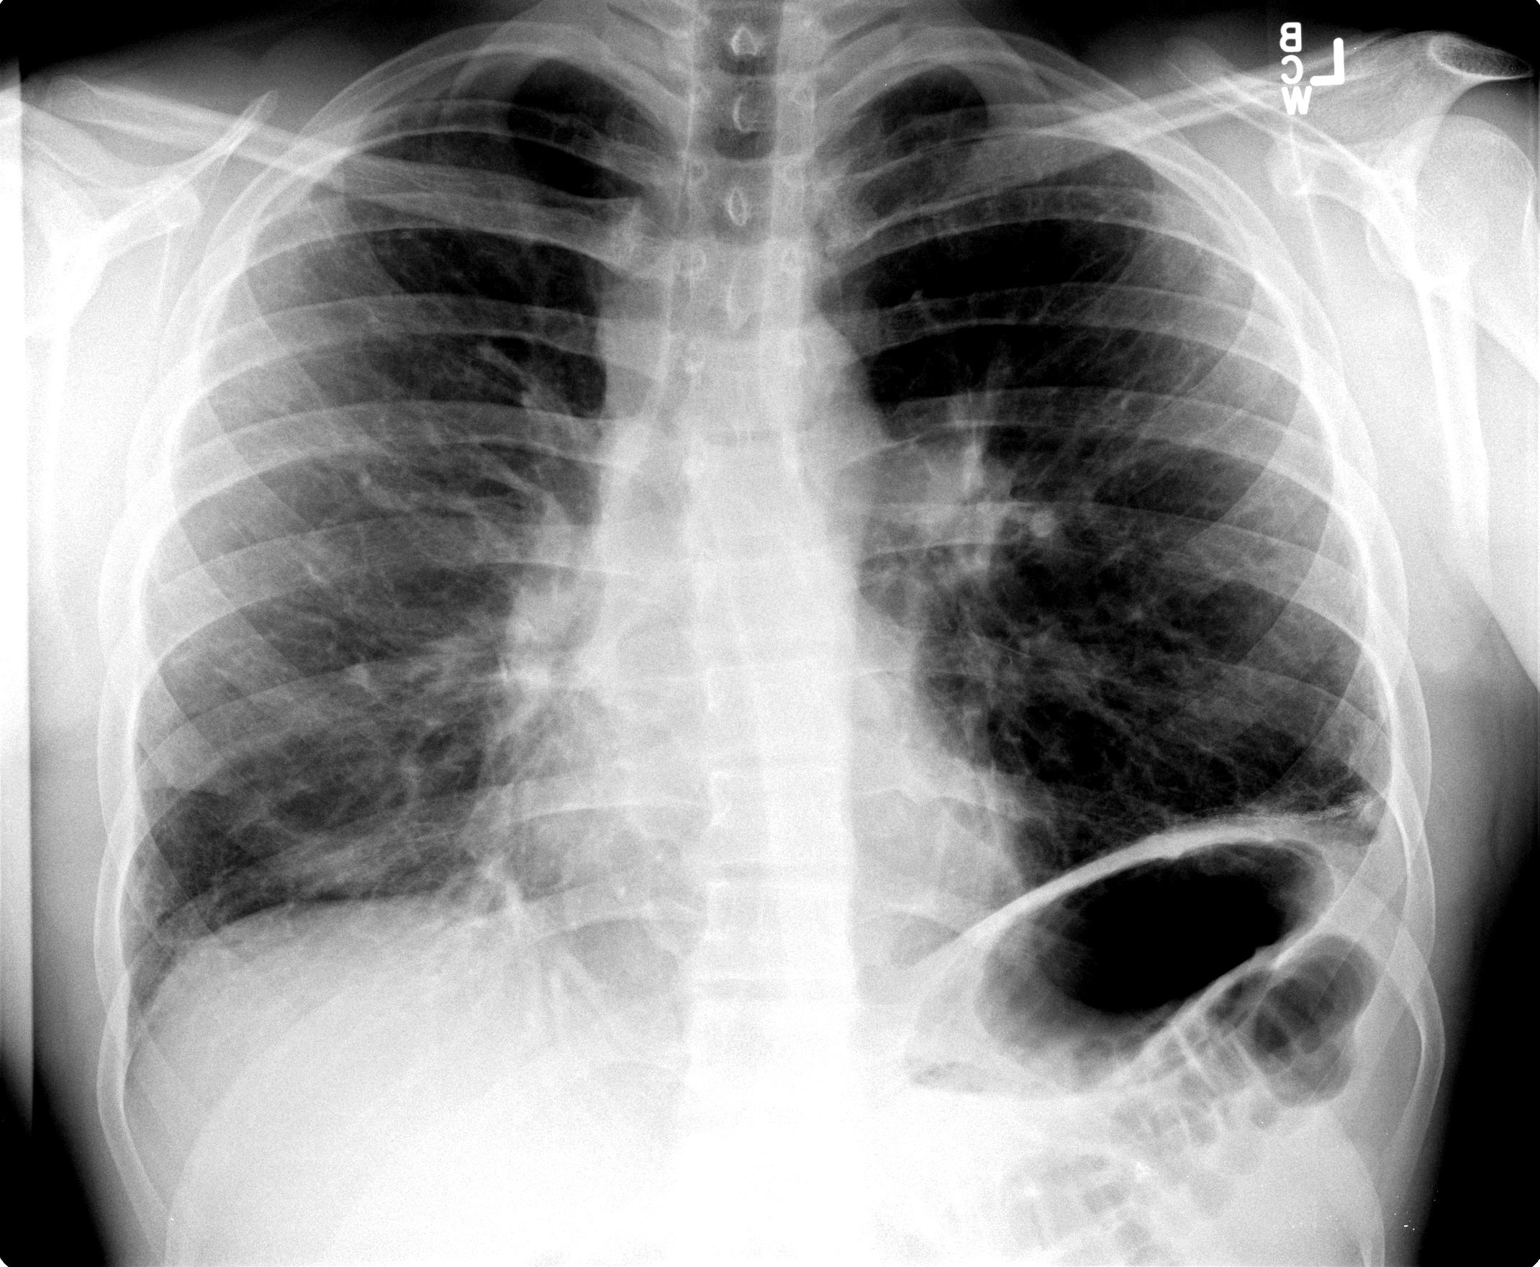

[view not recorded (2 of 2)]
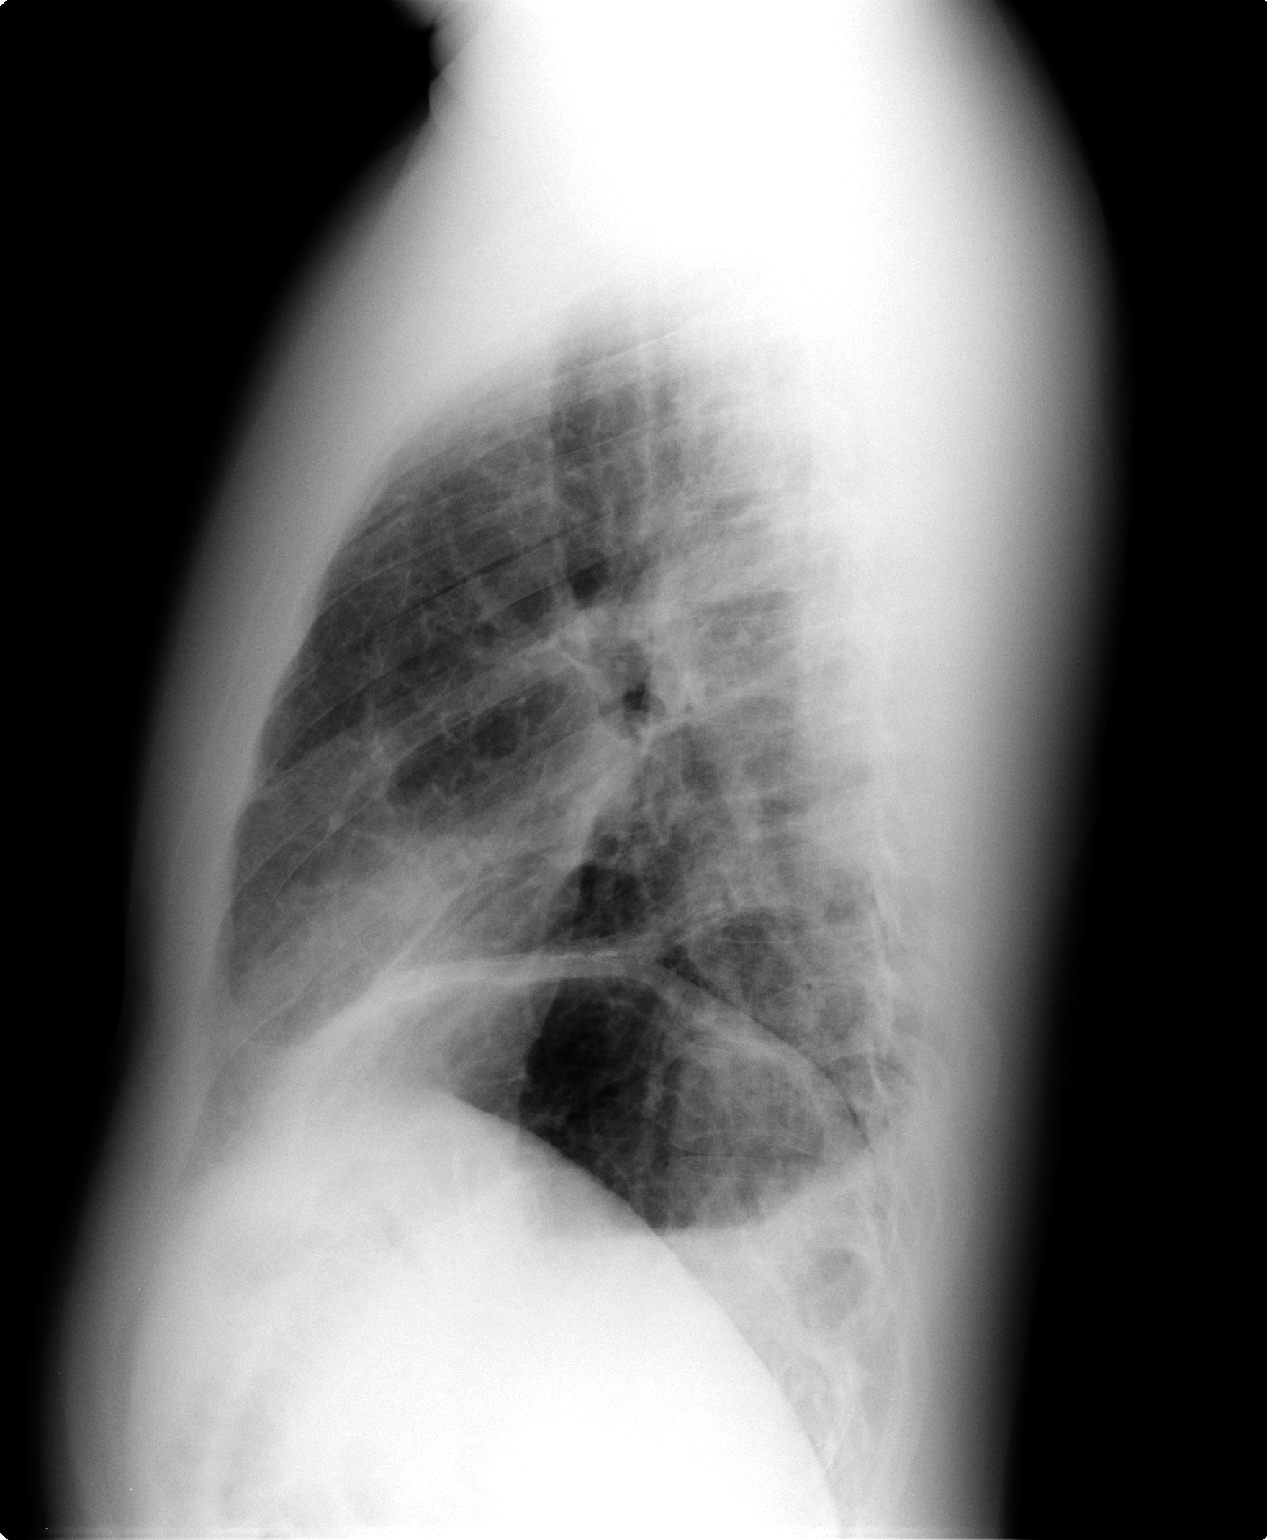

[2 of 2 positions shown; findings below may reference images not displayed]

FINDINGS: Similarly elevated left hemidiaphragm with scarring in left lung
base. Strandy densities at right lower lobe, similar. Similar
chronic interstitial changes. No pleural effusions. Similar fullness
of left pulmonary hilum. No pneumothorax. Soft tissue planes and
included osseous structures are nonsuspicious.
IMPRESSION: Stable chronic changes, scarring in left lung base without
superimposed acute cardiopulmonary process.

  By: Quirijn Amazigh

## 2016-07-10 ENCOUNTER — Other Ambulatory Visit: Payer: Self-pay | Admitting: Family Medicine

## 2016-08-17 ENCOUNTER — Other Ambulatory Visit: Payer: Self-pay | Admitting: Family Medicine

## 2016-08-17 NOTE — Telephone Encounter (Signed)
Last vitamin d level 03/2015

## 2016-08-18 NOTE — Telephone Encounter (Signed)
Vitamin d refill denied- [employer]

## 2016-09-01 ENCOUNTER — Encounter: Payer: Self-pay | Admitting: Family

## 2016-09-01 ENCOUNTER — Ambulatory Visit (INDEPENDENT_AMBULATORY_CARE_PROVIDER_SITE_OTHER): Payer: BLUE CROSS/BLUE SHIELD | Admitting: Family

## 2016-09-01 VITALS — BP 132/89 | HR 76 | Temp 97.0°F | Ht 71.0 in | Wt 189.4 lb

## 2016-09-01 DIAGNOSIS — Z0000 Encounter for general adult medical examination without abnormal findings: Secondary | ICD-10-CM | POA: Diagnosis not present

## 2016-09-01 NOTE — Progress Notes (Signed)
   Subjective:    Patient ID: Matthew Valdez, male    DOB: 1990-12-22, 26 y.o.   MRN: [id number]  HPI PT presents to the office today for CPE. PT has GERD that states he is controlling diet and exercise and occasionally tums. States this is stable.   He is also complaining of dull achy pain in lower left leg when he runs. States he "notices some varicose veins that seem to bulge"    Review of Systems  All other systems reviewed and are negative.      Objective:   Physical Exam  Constitutional: He is oriented to person, place, and time. He appears well-developed and well-nourished. No distress.  HENT:  Head: Normocephalic.  Right Ear: External ear normal.  Left Ear: External ear normal.  Nose: Nose normal.  Mouth/Throat: Oropharynx is clear and moist.  Eyes: Pupils are equal, round, and reactive to light. Right eye exhibits no discharge. Left eye exhibits no discharge.  Neck: Normal range of motion. Neck supple. No thyromegaly present.  Cardiovascular: Normal rate, regular rhythm, normal heart sounds and intact distal pulses.   No murmur heard. Pulmonary/Chest: Effort normal and breath sounds normal. No respiratory distress. He has no wheezes.  Abdominal: Soft. Bowel sounds are normal. He exhibits no distension. There is no tenderness.  Musculoskeletal: Normal range of motion. He exhibits no edema or tenderness.  Neurological: He is alert and oriented to person, place, and time. He has normal reflexes. No cranial nerve deficit.  Skin: Skin is warm and dry. No rash noted. No erythema.  Psychiatric: He has a normal mood and affect. His behavior is normal. Judgment and thought content normal.  Vitals reviewed.    BP 132/89   Pulse 76   Temp 97 F (36.1 C) (Oral)   Ht _0  (1.803 m)   Wt 189 lb 6.4 oz (85.9 kg)   BMI 26.42 kg/m       Assessment & Plan:  1. Annual physical exam - CMP14+EGFR - CBC with Differential/Platelet - Lipid panel - Thyroid Panel With TSH -  VITAMIN D 25 Hydroxy (Vit-D Deficiency, Fractures)   Continue all meds Labs pending Health Maintenance reviewed Diet and exercise encouraged RTO 1 year  Evelina Dun, FNP

## 2016-09-01 NOTE — Patient Instructions (Signed)
 Health Maintenance, Male A healthy lifestyle and preventive care is important for your health and wellness. Ask your health care provider about what schedule of regular examinations is right for you. What should I know about weight and diet?  Eat a Healthy Diet  Eat plenty of vegetables, fruits, whole grains, low-fat dairy products, and lean protein.  Do not eat a lot of foods high in solid fats, added sugars, or salt. Maintain a Healthy Weight  Regular exercise can help you achieve or maintain a healthy weight. You should:  Do at least 150 minutes of exercise each week. The exercise should increase your heart rate and make you sweat (moderate-intensity exercise).  Do strength-training exercises at least twice a week. Watch Your Levels of Cholesterol and Blood Lipids  Have your blood tested for lipids and cholesterol every 5 years starting at 25 years of age. If you are at high risk for heart disease, you should start having your blood tested when you are 25 years old. You may need to have your cholesterol levels checked more often if:  Your lipid or cholesterol levels are high.  You are older than 25 years of age.  You are at high risk for heart disease. What should I know about cancer screening? Many types of cancers can be detected early and may often be prevented. Lung Cancer  You should be screened every year for lung cancer if:  You are a current smoker who has smoked for at least 30 years.  You are a former smoker who has quit within the past 15 years.  Talk to your health care provider about your screening options, when you should start screening, and how often you should be screened. Colorectal Cancer  Routine colorectal cancer screening usually begins at 25 years of age and should be repeated every 5-10 years until you are 25 years old. You may need to be screened more often if early forms of precancerous polyps or small growths are found. Your health care provider  may recommend screening at an earlier age if you have risk factors for colon cancer.  Your health care provider may recommend using home test kits to check for hidden blood in the stool.  A small camera at the end of a tube can be used to examine your colon (sigmoidoscopy or colonoscopy). This checks for the earliest forms of colorectal cancer. Prostate and Testicular Cancer  Depending on your age and overall health, your health care provider may do certain tests to screen for prostate and testicular cancer.  Talk to your health care provider about any symptoms or concerns you have about testicular or prostate cancer. Skin Cancer  Check your skin from head to toe regularly.  Tell your health care provider about any new moles or changes in moles, especially if:  There is a change in a mole's size, shape, or color.  You have a mole that is larger than a pencil eraser.  Always use sunscreen. Apply sunscreen liberally and repeat throughout the day.  Protect yourself by wearing long sleeves, pants, a wide-brimmed hat, and sunglasses when outside. What should I know about heart disease, diabetes, and high blood pressure?  If you are 18-39 years of age, have your blood pressure checked every 3-5 years. If you are 40 years of age or older, have your blood pressure checked every year. You should have your blood pressure measured twice-once when you are at a hospital or clinic, and once when you are not at   a hospital or clinic. Record the average of the two measurements. To check your blood pressure when you are not at a hospital or clinic, you can use:  An automated blood pressure machine at a pharmacy.  A home blood pressure monitor.  Talk to your health care provider about your target blood pressure.  If you are between 45-79 years old, ask your health care provider if you should take aspirin to prevent heart disease.  Have regular diabetes screenings by checking your fasting blood sugar  level.  If you are at a normal weight and have a low risk for diabetes, have this test once every three years after the age of 45.  If you are overweight and have a high risk for diabetes, consider being tested at a younger age or more often.  A one-time screening for abdominal aortic aneurysm (AAA) by ultrasound is recommended for men aged 65-75 years who are current or former smokers. What should I know about preventing infection? Hepatitis B  If you have a higher risk for hepatitis B, you should be screened for this virus. Talk with your health care provider to find out if you are at risk for hepatitis B infection. Hepatitis C  Blood testing is recommended for:  Everyone born from 1945 through 1965.  Anyone with known risk factors for hepatitis C. Sexually Transmitted Diseases (STDs)  You should be screened each year for STDs including gonorrhea and chlamydia if:  You are sexually active and are younger than 24 years of age.  You are older than 24 years of age and your health care provider tells you that you are at risk for this type of infection.  Your sexual activity has changed since you were last screened and you are at an increased risk for chlamydia or gonorrhea. Ask your health care provider if you are at risk.  Talk with your health care provider about whether you are at high risk of being infected with HIV. Your health care provider may recommend a prescription medicine to help prevent HIV infection. What else can I do?  Schedule regular health, dental, and eye exams.  Stay current with your vaccines (immunizations).  Do not use any tobacco products, such as cigarettes, chewing tobacco, and e-cigarettes. If you need help quitting, ask your health care provider.  Limit alcohol intake to no more than 2 drinks per day. One drink equals 12 ounces of beer, 5 ounces of wine, or 1 ounces of hard liquor.  Do not use street drugs.  Do not share needles.  Ask your health  care provider for help if you need support or information about quitting drugs.  Tell your health care provider if you often feel depressed.  Tell your health care provider if you have ever been abused or do not feel safe at home. This information is not intended to replace advice given to you by your health care provider. Make sure you discuss any questions you have with your health care provider. Document Released: 10/07/2007 Document Revised: 12/08/2015 Document Reviewed: 01/12/2015 [employer] Interactive Patient Education  2017 [employer]  

## 2016-09-02 LAB — CBC WITH DIFFERENTIAL/PLATELET
BASOS: 0 %
Basophils Absolute: 0 10*3/uL (ref 0.0–0.2)
EOS (ABSOLUTE): 0.1 10*3/uL (ref 0.0–0.4)
EOS: 1 %
HEMOGLOBIN: 15.6 g/dL (ref 13.0–17.7)
Hematocrit: 44.6 % (ref 37.5–51.0)
IMMATURE GRANS (ABS): 0 10*3/uL (ref 0.0–0.1)
Immature Granulocytes: 0 %
Lymphocytes Absolute: 2.1 10*3/uL (ref 0.7–3.1)
Lymphs: 29 %
MCH: 31.8 pg (ref 26.6–33.0)
MCHC: 35 g/dL (ref 31.5–35.7)
MCV: 91 fL (ref 79–97)
MONOS ABS: 0.7 10*3/uL (ref 0.1–0.9)
Monocytes: 10 %
NEUTROS ABS: 4.4 10*3/uL (ref 1.4–7.0)
NEUTROS PCT: 60 %
PLATELETS: 227 10*3/uL (ref 150–379)
RBC: 4.9 x10E6/uL (ref 4.14–5.80)
RDW: 13.4 % (ref 12.3–15.4)
WBC: 7.4 10*3/uL (ref 3.4–10.8)

## 2016-09-02 LAB — CMP14+EGFR
A/G RATIO: 1.8 (ref 1.2–2.2)
ALBUMIN: 4.9 g/dL (ref 3.5–5.5)
ALT: 18 IU/L (ref 0–44)
AST: 19 IU/L (ref 0–40)
Alkaline Phosphatase: 44 IU/L (ref 39–117)
BUN / CREAT RATIO: 14 (ref 9–20)
BUN: 14 mg/dL (ref 6–20)
Bilirubin Total: 0.9 mg/dL (ref 0.0–1.2)
CALCIUM: 9.9 mg/dL (ref 8.7–10.2)
CO2: 25 mmol/L (ref 18–29)
CREATININE: 0.97 mg/dL (ref 0.76–1.27)
Chloride: 100 mmol/L (ref 96–106)
GFR, EST AFRICAN AMERICAN: 125 mL/min/{1.73_m2} (ref >59)
GFR, EST NON AFRICAN AMERICAN: 108 mL/min/{1.73_m2} (ref >59)
GLUCOSE: 89 mg/dL (ref 65–99)
Globulin, Total: 2.7 g/dL (ref 1.5–4.5)
Potassium: 4.5 mmol/L (ref 3.5–5.2)
SODIUM: 141 mmol/L (ref 134–144)
TOTAL PROTEIN: 7.6 g/dL (ref 6.0–8.5)

## 2016-09-02 LAB — LIPID PANEL
Chol/HDL Ratio: 4.8 ratio (ref 0.0–5.0)
Cholesterol, Total: 187 mg/dL (ref 100–199)
HDL: 39 mg/dL — AB (ref >39)
LDL Calculated: 98 mg/dL (ref 0–99)
Triglycerides: 251 mg/dL — ABNORMAL HIGH (ref 0–149)
VLDL CHOLESTEROL CAL: 50 mg/dL — AB (ref 5–40)

## 2016-09-02 LAB — THYROID PANEL WITH TSH
Free Thyroxine Index: 2.6 (ref 1.2–4.9)
T3 Uptake Ratio: 32 % (ref 24–39)
T4, Total: 8.1 ug/dL (ref 4.5–12.0)
TSH: 1.67 u[IU]/mL (ref 0.450–4.500)

## 2016-09-02 LAB — VITAMIN D 25 HYDROXY (VIT D DEFICIENCY, FRACTURES): VIT D 25 HYDROXY: 47.8 ng/mL (ref 30.0–100.0)
